# Patient Record
Sex: Female | Born: 1955
Health system: Southern US, Community
[De-identification: ages and names within clinical notes are randomized; demographics above are authoritative.]

## PROBLEM LIST (undated history)

## (undated) DIAGNOSIS — R918 Other nonspecific abnormal finding of lung field: Secondary | ICD-10-CM

## (undated) DIAGNOSIS — R112 Nausea with vomiting, unspecified: Secondary | ICD-10-CM

## (undated) DIAGNOSIS — E785 Hyperlipidemia, unspecified: Secondary | ICD-10-CM

## (undated) DIAGNOSIS — F329 Major depressive disorder, single episode, unspecified: Secondary | ICD-10-CM

## (undated) DIAGNOSIS — N390 Urinary tract infection, site not specified: Secondary | ICD-10-CM

## (undated) DIAGNOSIS — K5790 Diverticulosis of intestine, part unspecified, without perforation or abscess without bleeding: Secondary | ICD-10-CM

## (undated) DIAGNOSIS — J189 Pneumonia, unspecified organism: Secondary | ICD-10-CM

## (undated) DIAGNOSIS — J4521 Mild intermittent asthma with (acute) exacerbation: Secondary | ICD-10-CM

## (undated) DIAGNOSIS — B351 Tinea unguium: Secondary | ICD-10-CM

## (undated) DIAGNOSIS — M1990 Unspecified osteoarthritis, unspecified site: Secondary | ICD-10-CM

## (undated) DIAGNOSIS — C439 Malignant melanoma of skin, unspecified: Secondary | ICD-10-CM

## (undated) DIAGNOSIS — G43909 Migraine, unspecified, not intractable, without status migrainosus: Secondary | ICD-10-CM

## (undated) DIAGNOSIS — F32A Depression, unspecified: Secondary | ICD-10-CM

## (undated) DIAGNOSIS — Z9889 Other specified postprocedural states: Secondary | ICD-10-CM

## (undated) DIAGNOSIS — K219 Gastro-esophageal reflux disease without esophagitis: Secondary | ICD-10-CM

## (undated) DIAGNOSIS — I10 Essential (primary) hypertension: Secondary | ICD-10-CM

## (undated) HISTORY — DX: Malignant melanoma of skin, unspecified: C43.9

## (undated) HISTORY — PX: KNEE ARTHROSCOPY: SUR90

## (undated) HISTORY — DX: Essential (primary) hypertension: I10

## (undated) HISTORY — PX: OTHER SURGICAL HISTORY: SHX169

## (undated) HISTORY — DX: Mild intermittent asthma with (acute) exacerbation: J45.21

## (undated) HISTORY — DX: Tinea unguium: B35.1

## (undated) HISTORY — DX: Diverticulosis of intestine, part unspecified, without perforation or abscess without bleeding: K57.90

## (undated) HISTORY — DX: Depression, unspecified: F32.A

## (undated) HISTORY — DX: Unspecified osteoarthritis, unspecified site: M19.90

## (undated) HISTORY — DX: Other nonspecific abnormal finding of lung field: R91.8

## (undated) HISTORY — DX: Migraine, unspecified, not intractable, without status migrainosus: G43.909

## (undated) HISTORY — DX: Gastro-esophageal reflux disease without esophagitis: K21.9

## (undated) HISTORY — DX: Major depressive disorder, single episode, unspecified: F32.9

## (undated) HISTORY — DX: Hyperlipidemia, unspecified: E78.5

---

## 1996-01-23 HISTORY — PX: ABDOMINAL HYSTERECTOMY: SHX81

## 1997-05-03 ENCOUNTER — Other Ambulatory Visit: Admission: RE | Admit: 1997-05-03 | Discharge: 1997-05-03 | Payer: Self-pay | Admitting: Obstetrics and Gynecology

## 1999-02-06 ENCOUNTER — Ambulatory Visit (HOSPITAL_BASED_OUTPATIENT_CLINIC_OR_DEPARTMENT_OTHER): Admission: RE | Admit: 1999-02-06 | Discharge: 1999-02-06 | Payer: Self-pay | Admitting: Orthopedic Surgery

## 1999-07-04 ENCOUNTER — Encounter: Admission: RE | Admit: 1999-07-04 | Discharge: 1999-07-04 | Payer: Self-pay | Admitting: Obstetrics and Gynecology

## 1999-07-04 ENCOUNTER — Encounter: Payer: Self-pay | Admitting: Obstetrics and Gynecology

## 2000-07-09 ENCOUNTER — Encounter: Payer: Self-pay | Admitting: Obstetrics and Gynecology

## 2000-07-09 ENCOUNTER — Encounter: Admission: RE | Admit: 2000-07-09 | Discharge: 2000-07-09 | Payer: Self-pay | Admitting: Obstetrics and Gynecology

## 2001-03-05 ENCOUNTER — Encounter: Payer: Self-pay | Admitting: Obstetrics and Gynecology

## 2001-03-06 ENCOUNTER — Ambulatory Visit (HOSPITAL_COMMUNITY): Admission: RE | Admit: 2001-03-06 | Discharge: 2001-03-06 | Payer: Self-pay | Admitting: Obstetrics and Gynecology

## 2001-03-06 ENCOUNTER — Encounter: Payer: Self-pay | Admitting: Obstetrics and Gynecology

## 2001-03-12 ENCOUNTER — Encounter (INDEPENDENT_AMBULATORY_CARE_PROVIDER_SITE_OTHER): Payer: Self-pay | Admitting: Specialist

## 2001-03-12 ENCOUNTER — Inpatient Hospital Stay (HOSPITAL_COMMUNITY): Admission: RE | Admit: 2001-03-12 | Discharge: 2001-03-14 | Payer: Self-pay | Admitting: Obstetrics and Gynecology

## 2001-07-10 ENCOUNTER — Encounter: Payer: Self-pay | Admitting: Obstetrics and Gynecology

## 2001-07-10 ENCOUNTER — Encounter: Admission: RE | Admit: 2001-07-10 | Discharge: 2001-07-10 | Payer: Self-pay | Admitting: Obstetrics and Gynecology

## 2001-07-21 ENCOUNTER — Encounter: Payer: Self-pay | Admitting: Internal Medicine

## 2001-07-21 ENCOUNTER — Ambulatory Visit (HOSPITAL_COMMUNITY): Admission: RE | Admit: 2001-07-21 | Discharge: 2001-07-21 | Payer: Self-pay | Admitting: Internal Medicine

## 2001-10-23 ENCOUNTER — Encounter: Payer: Self-pay | Admitting: Internal Medicine

## 2001-10-23 ENCOUNTER — Ambulatory Visit (HOSPITAL_COMMUNITY): Admission: RE | Admit: 2001-10-23 | Discharge: 2001-10-23 | Payer: Self-pay | Admitting: Internal Medicine

## 2002-01-26 ENCOUNTER — Ambulatory Visit (HOSPITAL_COMMUNITY): Admission: RE | Admit: 2002-01-26 | Discharge: 2002-01-26 | Payer: Self-pay | Admitting: Internal Medicine

## 2002-01-26 ENCOUNTER — Encounter: Payer: Self-pay | Admitting: Internal Medicine

## 2002-07-29 ENCOUNTER — Encounter: Payer: Self-pay | Admitting: Internal Medicine

## 2002-07-29 ENCOUNTER — Ambulatory Visit (HOSPITAL_COMMUNITY): Admission: RE | Admit: 2002-07-29 | Discharge: 2002-07-29 | Payer: Self-pay | Admitting: Internal Medicine

## 2002-08-05 ENCOUNTER — Encounter: Payer: Self-pay | Admitting: Obstetrics and Gynecology

## 2002-08-05 ENCOUNTER — Encounter: Admission: RE | Admit: 2002-08-05 | Discharge: 2002-08-05 | Payer: Self-pay | Admitting: Obstetrics and Gynecology

## 2003-01-23 DIAGNOSIS — C439 Malignant melanoma of skin, unspecified: Secondary | ICD-10-CM

## 2003-01-23 HISTORY — DX: Malignant melanoma of skin, unspecified: C43.9

## 2003-02-04 ENCOUNTER — Ambulatory Visit (HOSPITAL_COMMUNITY): Admission: RE | Admit: 2003-02-04 | Discharge: 2003-02-04 | Payer: Self-pay | Admitting: Internal Medicine

## 2003-08-06 ENCOUNTER — Encounter: Admission: RE | Admit: 2003-08-06 | Discharge: 2003-08-06 | Payer: Self-pay | Admitting: Obstetrics and Gynecology

## 2004-08-16 ENCOUNTER — Encounter: Admission: RE | Admit: 2004-08-16 | Discharge: 2004-08-16 | Payer: Self-pay | Admitting: Obstetrics and Gynecology

## 2005-09-25 ENCOUNTER — Encounter: Admission: RE | Admit: 2005-09-25 | Discharge: 2005-09-25 | Payer: Self-pay | Admitting: Obstetrics and Gynecology

## 2006-03-12 ENCOUNTER — Ambulatory Visit: Payer: Self-pay | Admitting: Internal Medicine

## 2006-03-26 ENCOUNTER — Ambulatory Visit: Payer: Self-pay | Admitting: Internal Medicine

## 2006-09-27 ENCOUNTER — Encounter: Admission: RE | Admit: 2006-09-27 | Discharge: 2006-09-27 | Payer: Self-pay | Admitting: Obstetrics and Gynecology

## 2006-10-01 ENCOUNTER — Encounter: Admission: RE | Admit: 2006-10-01 | Discharge: 2006-10-01 | Payer: Self-pay | Admitting: Obstetrics and Gynecology

## 2007-06-12 ENCOUNTER — Encounter: Admission: RE | Admit: 2007-06-12 | Discharge: 2007-06-12 | Payer: Self-pay | Admitting: Obstetrics and Gynecology

## 2007-10-28 ENCOUNTER — Encounter: Admission: RE | Admit: 2007-10-28 | Discharge: 2007-10-28 | Payer: Self-pay | Admitting: Obstetrics and Gynecology

## 2008-12-07 ENCOUNTER — Encounter: Admission: RE | Admit: 2008-12-07 | Discharge: 2008-12-07 | Payer: Self-pay | Admitting: Obstetrics and Gynecology

## 2009-01-22 HISTORY — PX: BREAST BIOPSY: SHX20

## 2009-12-13 ENCOUNTER — Encounter: Admission: RE | Admit: 2009-12-13 | Discharge: 2009-12-13 | Payer: Self-pay | Admitting: Internal Medicine

## 2009-12-22 ENCOUNTER — Encounter: Admission: RE | Admit: 2009-12-22 | Discharge: 2009-12-22 | Payer: Self-pay | Admitting: Internal Medicine

## 2010-01-22 HISTORY — PX: JOINT REPLACEMENT: SHX530

## 2010-01-22 HISTORY — PX: BREAST BIOPSY: SHX20

## 2010-01-22 HISTORY — PX: BREAST EXCISIONAL BIOPSY: SUR124

## 2010-02-02 ENCOUNTER — Encounter
Admission: RE | Admit: 2010-02-02 | Discharge: 2010-02-02 | Payer: Self-pay | Source: Home / Self Care | Attending: Surgery | Admitting: Surgery

## 2010-02-02 ENCOUNTER — Ambulatory Visit (HOSPITAL_COMMUNITY)
Admission: RE | Admit: 2010-02-02 | Discharge: 2010-02-02 | Payer: Self-pay | Source: Home / Self Care | Attending: Surgery | Admitting: Surgery

## 2010-02-06 LAB — CBC
HCT: 37.2 % (ref 36.0–46.0)
Hemoglobin: 12.2 g/dL (ref 12.0–15.0)
MCH: 30.6 pg (ref 26.0–34.0)
MCHC: 32.8 g/dL (ref 30.0–36.0)
MCV: 93.2 fL (ref 78.0–100.0)
Platelets: 282 10*3/uL (ref 150–400)
RBC: 3.99 MIL/uL (ref 3.87–5.11)
RDW: 12.3 % (ref 11.5–15.5)
WBC: 8 10*3/uL (ref 4.0–10.5)

## 2010-02-06 LAB — BASIC METABOLIC PANEL
BUN: 17 mg/dL (ref 6–23)
CO2: 27 mEq/L (ref 19–32)
Calcium: 9.3 mg/dL (ref 8.4–10.5)
Chloride: 105 mEq/L (ref 96–112)
Creatinine, Ser: 0.69 mg/dL (ref 0.4–1.2)
GFR calc Af Amer: >60 mL/min (ref >60)
GFR calc non Af Amer: >60 mL/min (ref >60)
Glucose, Bld: 97 mg/dL (ref 70–99)
Potassium: 4.1 mEq/L (ref 3.5–5.1)
Sodium: 138 mEq/L (ref 135–145)

## 2010-02-06 LAB — DIFFERENTIAL
Basophils Absolute: 0 10*3/uL (ref 0.0–0.1)
Basophils Relative: 0 % (ref 0–1)
Eosinophils Absolute: 0.8 10*3/uL — ABNORMAL HIGH (ref 0.0–0.7)
Eosinophils Relative: 10 % — ABNORMAL HIGH (ref 0–5)
Lymphocytes Relative: 31 % (ref 12–46)
Lymphs Abs: 2.5 10*3/uL (ref 0.7–4.0)
Monocytes Absolute: 0.3 10*3/uL (ref 0.1–1.0)
Monocytes Relative: 4 % (ref 3–12)
Neutro Abs: 4.4 10*3/uL (ref 1.7–7.7)
Neutrophils Relative %: 55 % (ref 43–77)

## 2010-02-06 LAB — SURGICAL PCR SCREEN
MRSA, PCR: NEGATIVE
Staphylococcus aureus: NEGATIVE

## 2010-02-12 ENCOUNTER — Encounter: Payer: Self-pay | Admitting: Obstetrics and Gynecology

## 2010-06-09 NOTE — Op Note (Signed)
[facility]  Patient:    Casey Larsen, Casey Larsen Visit Number: [id number] MRN: [id number]          Service Type: Attending:  Katherine Roan, M.D. Dictated by:   S. Kyra Manges, M.D. Proc. Date: 03/12/01                             Operative Report  PREOPERATIVE DIAGNOSIS:  Large uterine fibroids.  POSTOPERATIVE DIAGNOSES: 1. Large uterine fibroids. 2. Left paraovarian cyst.  OPERATION: 1. Total abdominal hysterectomy. 2. Excision of left paraovarian cyst.  SURGEON:  S. Kyra Manges, M.D.  DESCRIPTION OF PROCEDURE:   The patient was placed in supine position after Foley was inserted and prepped and draped in the sterile fashion. A transverse incision was made in the abdomen and extended in layer to the peritoneal cavity, which was entered vertically. Exploration of the upper abdomen revealed a normal gallbladder. The liver was smooth. Both kidneys appeared to be normal. There was no periaortic adenopathy or any other mass that I could feel. There was no free fluid within the peritoneal cavity. The uterus was multinodular and as suggested about 14-week size. Cornual aspects of the uterus were grasped and elevated. She had a left paraovarian cyst which was simple in nature. The utero-ovarian anastomosis, round ligaments were sutured and ligated on each side. A bladder flap was created. Vessels were clamped and ligated. Cardinal-uterosacral complex was clamped and ligated and the specimens removed from the operative field. All pedicles were suture ligated with 0 chromic. Angles of the vagina were then transfixed with a horizontal mattress sutures of 0 chromic and then 0 Vicryl. Uterosacral ligaments were plicated carefully in the midline with 0 Vicryl. Bladder flap was then closed with interrupted sutures of 3-0 Vicryl. The paraovarian cyst was then delivered into the operative wound and excised preserving the left ovary. A portion of the tube came  with the paraovarian cyst. The right tube and ovary were normal. Copious amounts of saline were used to irrigate the pelvis. The parietal peritoneum was closed with 2-0 PDS. The fascia was closed with 2-0 PDS and the subcutaneum was closed with one 2-0 PDS. The skin was then closed with 4-0 PDS. Dry sterile dressing was applied. Marcaine 0.5% with epinephrine was instilled into the incision. The patient tolerated the procedure well. Estimated blood loss was 100 cc. Sponge and needle count was verified to be correct. Dictated by:   S. Kyra Manges, M.D. Attending:  Katherine Roan, M.D. DD:  03/12/01 TD:  03/12/01 Job: [id number]

## 2010-06-09 NOTE — Op Note (Signed)
[facility]  Patient:    Casey Larsen                           MRN: [id number] Proc. Date: 12/27/98 Adm. Date:  [id number] Attending:  Twana First                           Operative Report  PREOPERATIVE DIAGNOSIS:  Right knee lateral meniscal tear and lateral meniscal cyst.  POSTOPERATIVE DIAGNOSIS:  Right knee lateral meniscal tear and lateral meniscal  cyst.  OPERATION PERFORMED: 1. Right knee examination under anesthesia followed by arthroscopic partial    lateral meniscectomy. 2. Right knee open lateral meniscal cyst excision.  SURGEON:  Elana Alm. Thurston Hole, M.D.  ASSISTANT:  Kirstin Adelberger, P.A.  ANESTHESIA:  Local and MAC.  OPERATIVE TIME:  30 minutes.  COMPLICATIONS:  None.  INDICATIONS FOR PROCEDURE:  Ms. [name] is a 55 year old woman who has had three months of increasing right knee pain and swelling with lateral meniscal palpable cyst documented by MRI with an underlying lateral meniscal tear.  This is failed conservative care and he is now to undergo arthroscopy and meniscal cyst excision.  DESCRIPTION OF PROCEDURE:  Ms. [name] was brought to the operating room on December 27, 1998 after a block had been placed in the holding room.  Her right knee was examination under anesthesia.  Range of motion 0 to 125 degrees, 1+ crepitation, knee stable to ligamentous exam with a small palpable fullness over the lateral  joint line.  The right leg was prepped using sterile Betadine and draped using sterile technique.  Originally, through an inferolateral portal, the arthroscope with a pump attached was placed and through and inferomedial portal, an arthroscopic probe was placed.  On intial inspection of the medial compartment, the articular cartilage, medial femoral condyle, medial tibial plateau showed mild grade 1 to 2 changes.  The medial meniscus was probed and this was found to be normal.  The intercondylar notch was  inspected.  The anterior and posterior cruciate ligaments were found to be normal.  Lateral compartment inspected and articular cartilage, lateral femoral condyle was intact.  Lateral tibial plateau showed 30 to 40% grade 3 chondromalacia which was debrided.  The lateral meniscus showed complex tearing in the posterior and lateral horn of which 30 to 40% was  resected back to a stable rim.  The meniscal cyst could not be visualized from he inside of the joint.  The popliteus tendon was intact.  The patellofemoral joint was inspected.  The articular cartilage within the joint was normal.  The patella tracked normally.  Moderate synovitis in the medial and lateral gutters was debrided.  Otherwise they were free of pathology.  After this was done, the arthroscopic instruments were removed and a 2.5 to 3 cm lateral incision was made over the meniscal cyst.  The underlying subcutaneous tissues were incised in line with the skin incision.  The iliotibial band was opened lontitudinally revealing the underlying cyst which was removed and cauterized.  It was small but filled ith a gelatinous type ganglion type fluid.  The cyst was not sent for pathology due to the fact that it was partially destroyed in the midst of being cauterized and removed.  It appeared totally benign.  After this was done, no other pathology there was noted.  The wound was irrigated and then the iliotibial band closed with  2-0 Vicryl, subcutaneous tissues closed with 2-0 Vicryl, subcuticular layer closed with 3-0 Prolene.  Steri-Strips applied.  The wound was injected with 0.25% Marcaine with epinephrine and 5 mg of morphine.  A sterile dressing was applied and the patient awakened and taken to the recovery room in stable condition.  FOLLOW-UP:  Ms. [name] will be followed as an outpatient on Vicodin and Naprosyn.  I will see her back in the office in a week for suture removal and follow-up. DD:  02/06/99 TD:   02/06/99 Job: [id number]

## 2010-06-09 NOTE — H&P (Signed)
[facility]  Patient:    Casey Larsen, Casey Larsen Visit Number: [id number] MRN: [id number]          Service Type: Attending:  Katherine Roan, M.D. Dictated by:   S. Kyra Manges, M.D. Adm. Date:  03/12/01                           History and Physical  CHIEF COMPLAINT:  Large uterine fibroids.  HISTORY OF PRESENT ILLNESS:  The patient is a 55 year old gravida 2, para 2 female who presents for abdominal hysterectomy for enlarging uterine fibroids. Her periods have been controlled with Lo/Ovral; however, uterus is now about 14 weeks size, and it is recommended that she proceed with hysterectomy.  She is in agreement.  Her comorbidity is hypertension, which is well controlled. She has had knee surgery in the past.  She has had two spontaneous normal deliveries.  REVIEW OF SYSTEMS:  HEENT:  She wears reading glasses but has noted no decrease in visual or auditory acuity.  No dizziness.  HEART:  She has hypertension, well controlled.  No chest pain.  No history of heart murmur. No shortness of breath.  LUNGS:  No chronic cough.  No asthma.  GENITOURINARY: She denies stress incontinence.  She denies history of UTI for hematuria.  No history of nephritis.  GASTROINTESTINAL:  No bowel habit change, no melena, no weight loss, no indigestion.  MUSCLES, BONES, AND JOINTS:  She has had a recent arthroscopy.  SOCIAL HISTORY:  She works at the [employer].  Drinks alcohol socially.  Smokes a pack of cigarettes daily.  FAMILY HISTORY:  Her mother is living and well at 74.  Her father died at 31. Her father had an MI at 25.  He was diabetic.  She has one brother and one sister who are in good health.  She has a mother with breast cancer.  Her maternal aunt has lung cancer, and her uncle has "bone cancer."  PHYSICAL EXAMINATION:  GENERAL:  Well-developed and nourished female who appears to be her stated age.  Is oriented to time, place, and recent  events.  VITAL SIGNS:  Weight 156.  Blood pressure 120/60.  HEENT:  Ears, nose, throat unremarkable.  Oropharynx is not injected.  NECK:  Supple.  Thyroid is not enlarged.  Carotid pulses are equal.  No bruits are heard.  No adenopathy.  LUNGS:  Clear to P&A.  BREASTS:  No masses or tenderness.  HEART:  Normal sinus rhythm.  No murmurs.  No heaves, thrills, rubs, or gallops.  ABDOMEN:  Soft.  Liver, spleen, and kidneys are not palpated.  The uterus is palpated about three fingerbreadths above the umbilicus.  Bowel sounds are normal.  Femoral pulses are equal, without bruits.  PELVIC:  Clean cervix.  Uterus is anterior, about 14 weeks size with a large posterior fundal component.  No adnexal masses are noted.  RECTOVAGINAL:  Confirms.  EXTREMITIES:  Show good range of motion.  Equal pulses and reflexes.  IMPRESSION:  Large uterine fibroids.  PLAN:  Abdominal hysterectomy.  The risks have been discussed with the patient, and she has given detailed informed consent. Dictated by:   S. Kyra Manges, M.D. Attending:  Katherine Roan, M.D. DD:  03/11/01 TD:  03/11/01 Job: [id number]

## 2010-06-09 NOTE — Discharge Summary (Signed)
[facility]  Patient:    EVETT, KASSA Visit Number: [id number] MRN: [id number]          Service Type: GYN Location: 4W 0465 01 Attending Physician:  Lendon Colonel Dictated by:   Kathie Rhodes. Kyra Manges, M.D. Admit Date:  03/12/2001 Discharge Date: 03/14/2001                             Discharge Summary  ADMISSION DIAGNOSIS:  Large uterine fibroids.  DISCHARGE DIAGNOSIS:  Large uterine fibroids.  PROCEDURE:  Total abdominal hysterectomy.  HISTORY OF PRESENT ILLNESS:  Ms. Summons is a 55 year old female who was admitted to the hospital with a large uterine fibroid and hysterectomy was recommended.  LABORATORY DATA AND X-RAY FINDINGS:  Hemoglobin 13, hematocrit 38.  Routine chemistries normal.  Electrocardiogram normal.  Chest x-ray was normal.  This was noted after a limited CT was obtained.  HOSPITAL COURSE:  The patient was admitted to the hospital and underwent an uneventful hysterectomy with removal of a large uterine fibroid.  Uterine weight was 548 g.  There also was a left paraovarian cyst that was removed.  Her postoperative course was uncomplicated.  She remained afebrile without complaint on the day of discharge.  SPECIAL INSTRUCTIONS:  She was asked to call for fever or bleeding.  DISCHARGE MEDICATION:  Percocet for pain.  FOLLOWUP:  She will return to the office in six weeks.  CONDITION ON DISCHARGE:  Improved. Dictated by:   S. Kyra Manges, M.D. Attending Physician:  Lendon Colonel DD:  03/20/01 TD:  03/21/01 Job: [id number]

## 2010-11-09 ENCOUNTER — Other Ambulatory Visit: Payer: Self-pay | Admitting: Internal Medicine

## 2010-11-09 DIAGNOSIS — Z1231 Encounter for screening mammogram for malignant neoplasm of breast: Secondary | ICD-10-CM

## 2010-12-19 ENCOUNTER — Ambulatory Visit
Admission: RE | Admit: 2010-12-19 | Discharge: 2010-12-19 | Disposition: A | Discharge status: Home / Self Care | Payer: BC Managed Care – PPO | Source: Ambulatory Visit | Attending: Internal Medicine | Admitting: Internal Medicine

## 2010-12-19 DIAGNOSIS — Z1231 Encounter for screening mammogram for malignant neoplasm of breast: Secondary | ICD-10-CM

## 2011-02-15 ENCOUNTER — Other Ambulatory Visit: Payer: Self-pay | Admitting: Physician Assistant

## 2011-02-23 ENCOUNTER — Encounter: Payer: Self-pay | Admitting: Internal Medicine

## 2011-03-15 ENCOUNTER — Other Ambulatory Visit: Payer: Self-pay | Admitting: Physician Assistant

## 2011-03-29 ENCOUNTER — Encounter: Payer: Self-pay | Admitting: Internal Medicine

## 2011-05-08 ENCOUNTER — Ambulatory Visit (AMBULATORY_SURGERY_CENTER): Payer: BC Managed Care – PPO | Admitting: *Deleted

## 2011-05-08 VITALS — Ht 68.0 in | Wt 193.2 lb

## 2011-05-08 DIAGNOSIS — Z1211 Encounter for screening for malignant neoplasm of colon: Secondary | ICD-10-CM

## 2011-05-08 DIAGNOSIS — Z800 Family history of malignant neoplasm of digestive organs: Secondary | ICD-10-CM

## 2011-05-08 MED ORDER — PEG-KCL-NACL-NASULF-NA ASC-C 100 G PO SOLR: ORAL | Status: DC

## 2011-05-09 ENCOUNTER — Encounter: Payer: Self-pay | Admitting: Internal Medicine

## 2011-05-22 ENCOUNTER — Encounter: Payer: Self-pay | Admitting: Internal Medicine

## 2011-05-22 ENCOUNTER — Ambulatory Visit (AMBULATORY_SURGERY_CENTER): Payer: BC Managed Care – PPO | Admitting: Internal Medicine

## 2011-05-22 VITALS — BP 147/76 | HR 64 | Temp 97.8°F | Resp 14 | Ht 68.0 in | Wt 193.0 lb

## 2011-05-22 DIAGNOSIS — Z8371 Family history of colonic polyps: Secondary | ICD-10-CM

## 2011-05-22 DIAGNOSIS — Z1211 Encounter for screening for malignant neoplasm of colon: Secondary | ICD-10-CM

## 2011-05-22 DIAGNOSIS — K5730 Diverticulosis of large intestine without perforation or abscess without bleeding: Secondary | ICD-10-CM

## 2011-05-22 HISTORY — PX: COLONOSCOPY: SHX174

## 2011-05-22 MED ORDER — SODIUM CHLORIDE 0.9 % IV SOLN: 500.0000 mL | INTRAVENOUS | Status: DC

## 2011-05-22 NOTE — Progress Notes (Signed)
Patient did not have preoperative order for IV antibiotic SSI prophylaxis. (G8918)  Patient did not experience any of the following events: a burn prior to discharge; a fall within the facility; wrong site/side/patient/procedure/implant event; or a hospital transfer or hospital admission upon discharge from the facility. (G8907)  

## 2011-05-22 NOTE — Patient Instructions (Signed)
YOU HAD AN ENDOSCOPIC PROCEDURE TODAY AT THE [facility]: Refer to the procedure report that was given to you for any specific questions about what was found during the examination.  If the procedure report does not answer your questions, please call your gastroenterologist to clarify.  If you requested that your care partner not be given the details of your procedure findings, then the procedure report has been included in a sealed envelope for you to review at your convenience later.  YOU SHOULD EXPECT: Some feelings of bloating in the abdomen. Passage of more gas than usual.  Walking can help get rid of the air that was put into your GI tract during the procedure and reduce the bloating. If you had a lower endoscopy (such as a colonoscopy or flexible sigmoidoscopy) you may notice spotting of blood in your stool or on the toilet paper. If you underwent a bowel prep for your procedure, then you may not have a normal bowel movement for a few days.  DIET: Your first meal following the procedure should be a light meal and then it is ok to progress to your normal diet.  A half-sandwich or bowl of soup is an example of a good first meal.  Heavy or fried foods are harder to digest and may make you feel nauseous or bloated.  Likewise meals heavy in dairy and vegetables can cause extra gas to form and this can also increase the bloating.  Drink plenty of fluids but you should avoid alcoholic beverages for 24 hours.  ACTIVITY: Your care partner should take you home directly after the procedure.  You should plan to take it easy, moving slowly for the rest of the day.  You can resume normal activity the day after the procedure however you should NOT DRIVE or use heavy machinery for 24 hours (because of the sedation medicines used during the test).    SYMPTOMS TO REPORT IMMEDIATELY: A gastroenterologist can be reached at any hour.  During normal business hours, 8:30 AM to 5:00 PM Monday through Friday,  call [phone].  After hours and on weekends, please call the GI answering service at [phone] who will take a message and have the physician on call contact you.   Following lower endoscopy (colonoscopy or flexible sigmoidoscopy):  Excessive amounts of blood in the stool  Significant tenderness or worsening of abdominal pains  Swelling of the abdomen that is new, acute  Fever of 100F or higher    FOLLOW UP: If any biopsies were taken you will be contacted by phone or by letter within the next 1-3 weeks.  Call your gastroenterologist if you have not heard about the biopsies in 3 weeks.  Our staff will call the home number listed on your records the next business day following your procedure to check on you and address any questions or concerns that you may have at that time regarding the information given to you following your procedure. This is a courtesy call and so if there is no answer at the home number and we have not heard from you through the emergency physician on call, we will assume that you have returned to your regular daily activities without incident.  SIGNATURES/CONFIDENTIALITY: You and/or your care partner have signed paperwork which will be entered into your electronic medical record.  These signatures attest to the fact that that the information above on your After Visit Summary has been reviewed and is understood.  Full responsibility of the confidentiality   of this discharge information lies with you and/or your care-partner.     

## 2011-05-22 NOTE — Op Note (Signed)
 [facility] [address] [place], [place]  [address]  COLONOSCOPY PROCEDURE REPORT  PATIENT:  Casey Larsen, Casey Larsen  MR#:  [id number] BIRTHDATE:  15-Sep-1955, 56 yrs. old  GENDER:  female ENDOSCOPIST:  Wilhemina Bonito. Eda Keys, MD REF. BY:  Screening / Recall PROCEDURE DATE:  05/22/2011 PROCEDURE:  Higher-risk screening colonoscopy G0105  ASA CLASS:  Class II INDICATIONS:  surveillance and high-risk screening, family Hx of polyps ; index 03-2006 (severe diverticulosis with deep folds) MEDICATIONS:   MAC sedation, administered by CRNA, propofol (Diprivan) 350 mg IV  DESCRIPTION OF PROCEDURE:   After the risks benefits and alternatives of the procedure were thoroughly explained, informed consent was obtained.  Digital rectal exam was performed and revealed no abnormalities.   The LB CF-[id number] endoscope was introduced through the anus and advanced to the cecum, which was identified by both the appendix and ileocecal valve, without limitations.  The quality of the prep was excellent, using MoviPrep.  The instrument was then slowly withdrawn as the colon was fully examined. <<PROCEDUREIMAGES>>  FINDINGS:  Severe diverticulosis was found throughout the colon. Otherwise normal colonoscopy without  polyps, masses, vascular ectasias, or inflammatory changes.   Retroflexed views in the rectum revealed no abnormalities.    The time to cecum = 5:16 minutes. The scope was then withdrawn in  10:15  minutes from the cecum and the procedure completed.  COMPLICATIONS:  None  ENDOSCOPIC IMPRESSION: 1) Severe diverticulosis throughout the colon 2) Otherwise normal colonoscopy  RECOMMENDATIONS: 1) Continue current colorectal screening recommendations  with a repeat colonoscopy in 10 years.  ______________________________ Wilhemina Bonito. Eda Keys, MD  CC:  Geoffry Paradise, MD;  The Patient  n. eSIGNED:   Wilhemina Bonito. Eda Keys at 05/22/2011 10:03 AM  Mylo Red, [phone]

## 2011-05-22 NOTE — Progress Notes (Signed)
PT. Started having hiccups clear secretions from mouth noted. Marrion Coy CRNA  Suction small amount of secretions.

## 2011-05-23 ENCOUNTER — Telehealth: Payer: Self-pay | Admitting: *Deleted

## 2011-05-23 NOTE — Telephone Encounter (Signed)
  Follow up Call-  Call back number 05/22/2011  Post procedure Call Back phone  # [phone]  Permission to leave phone message Yes     Patient questions:  Do you have a fever, pain , or abdominal swelling? no Pain Score  0 *  Have you tolerated food without any problems? yes  Have you been able to return to your normal activities? yes  Do you have any questions about your discharge instructions: Diet   no Medications  no Follow up visit  no  Do you have questions or concerns about your Care? no  Actions: * If pain score is 4 or above: No action needed, pain <4.

## 2011-08-04 ENCOUNTER — Emergency Department (INDEPENDENT_AMBULATORY_CARE_PROVIDER_SITE_OTHER)
Admission: EM | Admit: 2011-08-04 | Discharge: 2011-08-04 | Disposition: A | Discharge status: Home / Self Care | Payer: BC Managed Care – PPO | Source: Home / Self Care | Attending: Emergency Medicine | Admitting: Emergency Medicine

## 2011-08-04 ENCOUNTER — Encounter (HOSPITAL_COMMUNITY): Payer: Self-pay | Admitting: Emergency Medicine

## 2011-08-04 DIAGNOSIS — L0390 Cellulitis, unspecified: Secondary | ICD-10-CM

## 2011-08-04 DIAGNOSIS — L0291 Cutaneous abscess, unspecified: Secondary | ICD-10-CM

## 2011-08-04 MED ORDER — CLINDAMYCIN HCL 150 MG PO CAPS: 300.0000 mg | ORAL_CAPSULE | Freq: Four times a day (QID) | ORAL | Status: AC

## 2011-08-04 NOTE — ED Notes (Addendum)
Swelling and redness extends above ankle. Pedal pulses 2 plus

## 2011-08-04 NOTE — ED Provider Notes (Signed)
Medical screening examination/treatment/procedure(s) were performed by non-physician practitioner and as supervising physician I was immediately available for consultation/collaboration.  Garlon Tuggle M. MD   Nolita Kutter M Malaky Tetrault, MD 08/04/11 2206 

## 2011-08-04 NOTE — ED Provider Notes (Signed)
History     CSN: [id number]  Arrival date & time 08/04/11  1135   First MD Initiated Contact with Patient 08/04/11 1234      Chief Complaint  Patient presents with  . Foot Pain    (Consider location/radiation/quality/duration/timing/severity/associated sxs/prior treatment) HPI Comments: Pt states that she was on the riding mower yesterday and she felt like something bit of stung her and now she is having redness to foot and ankle:pt denies history of reaction:pt has not tried any otc medication:pt denies any problems breathing  Patient is a 56 y.o. female presenting with lower extremity pain. The history is provided by the patient. No language interpreter was used.  Foot Pain This is a new problem. The current episode started yesterday. The problem occurs constantly. The problem has been gradually worsening. Pertinent negatives include no chest pain and no shortness of breath. Nothing aggravates the symptoms. Nothing relieves the symptoms. She has tried nothing for the symptoms.    Past Medical History  Diagnosis Date  . Arthritis     knees  . Melanoma 2005    face  . Depression   . GERD (gastroesophageal reflux disease)   . Hypertension     Past Surgical History  Procedure Date  . Abdominal hysterectomy 1998  . Knee arthroscopy 2000,2003    right and left  . Bone spurs 2010, 2012    both feet  . Joint replacement 2012    left foot  . Breast biopsy 2012    right, benign    Family History  Problem Relation Age of Onset  . Colon cancer Maternal Grandmother   . Stomach cancer Neg Hx     History  Substance Use Topics  . Smoking status: Former Smoker    Quit date: 05/07/2005  . Smokeless tobacco: Never Used  . Alcohol Use: 0.0 oz/week     occasional    OB History    Grav Para Term Preterm Abortions TAB SAB Ect Mult Living                  Review of Systems  Constitutional: Negative.   Respiratory: Negative for shortness of breath.   Cardiovascular:  Negative for chest pain.  Skin:       Red and swelling to the right foot    Allergies  Review of patient's allergies indicates no known allergies.  Home Medications   Current Outpatient Rx  Name Route Sig Dispense Refill  . CITALOPRAM HYDROBROMIDE 20 MG PO TABS Oral Take 20 mg by mouth daily.    Marland Kitchen GLUCOSAMINE-CHONDROITIN 500-400 MG PO TABS Oral Take 2 tablets by mouth daily.    Marland Kitchen LISINOPRIL-HYDROCHLOROTHIAZIDE 20-12.5 MG PO TABS Oral Take 2 tablets by mouth daily.    . MELOXICAM 15 MG PO TABS Oral Take 15 mg by mouth daily.     Marland Kitchen ONE-DAILY MULTI VITAMINS PO TABS Oral Take 1 tablet by mouth daily.    Marland Kitchen OMEPRAZOLE 20 MG PO CPDR Oral Take 20 mg by mouth daily.    Marland Kitchen ZOLPIDEM TARTRATE 10 MG PO TABS Oral Take 10 mg by mouth at bedtime as needed. Takes 0.5 tablet as needed      BP 131/91  Pulse 78  Temp 98.2 F (36.8 C) (Oral)  Resp 18  SpO2 99%  Physical Exam  Nursing note and vitals reviewed. Constitutional: She is oriented to person, place, and time. She appears well-developed and well-nourished.  HENT:  Head: Normocephalic and atraumatic.  Eyes: EOM are  normal.  Cardiovascular: Normal rate and regular rhythm.   Pulmonary/Chest: Effort normal and breath sounds normal.  Musculoskeletal:       Pulses intact  Neurological: She is alert and oriented to person, place, and time.  Skin:       Localized redness noted to the top of the right foot going towards the ankle:mild warmth noted:No localized insect bite noted    ED Course  Procedures (including critical care time)  Labs Reviewed - No data to display No results found.   1. Cellulitis       MDM  Cellulitis vs allergic reaction:will treat pt for both:pt tetanus is utd:pt given follow up instructions for benadryl and pepcid otc      mor  Teressa Lower, NP 08/04/11 1312

## 2011-08-04 NOTE — ED Notes (Signed)
Right foot swollen, noticed yesterday.  Reports she was mowing when she felt like a bee sting, reports never saw anything on her foot.  Reports this is different presentation than when she has been stung before.

## 2011-09-10 ENCOUNTER — Emergency Department (HOSPITAL_COMMUNITY): Payer: BC Managed Care – PPO

## 2011-09-10 ENCOUNTER — Emergency Department (HOSPITAL_COMMUNITY)
Admission: EM | Admit: 2011-09-10 | Discharge: 2011-09-10 | Disposition: A | Discharge status: Home / Self Care | Payer: BC Managed Care – PPO | Attending: Emergency Medicine | Admitting: Emergency Medicine

## 2011-09-10 ENCOUNTER — Encounter (HOSPITAL_COMMUNITY): Payer: Self-pay | Admitting: Emergency Medicine

## 2011-09-10 DIAGNOSIS — W19XXXA Unspecified fall, initial encounter: Secondary | ICD-10-CM | POA: Insufficient documentation

## 2011-09-10 DIAGNOSIS — T1490XA Injury, unspecified, initial encounter: Secondary | ICD-10-CM | POA: Insufficient documentation

## 2011-09-10 NOTE — ED Notes (Signed)
PT. SLIPPED ON WET WOOD THIS EVENING , HIT HEAD AGAINST PAVEMENT , NO LOC , PRESENTS WITH RIGHT KNEE PAIN WITH SUPERFICIAL ABRASION , SMALL LACERATION AT CHIN - BLEEDING CONTROLLED / RIGHT FACIAL PAIN . ALERT AND ORIENTED.

## 2011-11-14 ENCOUNTER — Other Ambulatory Visit: Payer: Self-pay | Admitting: Internal Medicine

## 2011-11-14 DIAGNOSIS — Z1231 Encounter for screening mammogram for malignant neoplasm of breast: Secondary | ICD-10-CM

## 2011-12-25 ENCOUNTER — Ambulatory Visit
Admission: RE | Admit: 2011-12-25 | Discharge: 2011-12-25 | Disposition: A | Discharge status: Home / Self Care | Payer: BC Managed Care – PPO | Source: Ambulatory Visit | Attending: Internal Medicine | Admitting: Internal Medicine

## 2011-12-25 DIAGNOSIS — Z1231 Encounter for screening mammogram for malignant neoplasm of breast: Secondary | ICD-10-CM

## 2012-03-12 DIAGNOSIS — B351 Tinea unguium: Secondary | ICD-10-CM

## 2012-03-12 HISTORY — DX: Tinea unguium: B35.1

## 2012-05-20 ENCOUNTER — Telehealth: Payer: Self-pay | Admitting: Internal Medicine

## 2012-05-20 NOTE — Telephone Encounter (Signed)
Pt scheduled to see Dr. Marina Goodell 05/22/12@10 :15am. Malachi Bonds to notify pt of appt date and time. States she will fax records.

## 2012-05-22 ENCOUNTER — Ambulatory Visit: Payer: BC Managed Care – PPO | Admitting: Internal Medicine

## 2012-10-13 ENCOUNTER — Encounter: Payer: Self-pay | Admitting: *Deleted

## 2012-10-13 DIAGNOSIS — B351 Tinea unguium: Secondary | ICD-10-CM | POA: Insufficient documentation

## 2012-10-23 ENCOUNTER — Ambulatory Visit (INDEPENDENT_AMBULATORY_CARE_PROVIDER_SITE_OTHER): Payer: PRIVATE HEALTH INSURANCE | Admitting: Podiatry

## 2012-10-23 ENCOUNTER — Encounter: Payer: Self-pay | Admitting: Podiatry

## 2012-10-23 VITALS — BP 116/64 | HR 74 | Resp 12

## 2012-10-23 DIAGNOSIS — B351 Tinea unguium: Secondary | ICD-10-CM

## 2012-10-23 NOTE — Progress Notes (Signed)
Subjective:     Patient ID: Casey Larsen, female   DOB: 31-May-1955, 57 y.o.   MRN: [id number]  HPI nail fungus hallux long-term duration   Review of Systems  All other systems reviewed and are negative.       Objective:   Physical Exam  Cardiovascular: Intact distal pulses.        Assessment:    mycotic nails improving with laser    Plan:     Laser performed today 1100 shocks that did well

## 2012-10-23 NOTE — Patient Instructions (Signed)
Call if nails change for 30 days of lamisil

## 2012-11-17 ENCOUNTER — Other Ambulatory Visit: Payer: Self-pay

## 2012-11-17 DIAGNOSIS — Z1231 Encounter for screening mammogram for malignant neoplasm of breast: Secondary | ICD-10-CM

## 2012-12-25 ENCOUNTER — Ambulatory Visit: Payer: BC Managed Care – PPO

## 2012-12-25 ENCOUNTER — Ambulatory Visit
Admission: RE | Admit: 2012-12-25 | Discharge: 2012-12-25 | Disposition: A | Discharge status: Home / Self Care | Payer: PRIVATE HEALTH INSURANCE | Source: Ambulatory Visit

## 2012-12-25 DIAGNOSIS — Z1231 Encounter for screening mammogram for malignant neoplasm of breast: Secondary | ICD-10-CM

## 2012-12-29 ENCOUNTER — Other Ambulatory Visit: Payer: Self-pay | Admitting: Internal Medicine

## 2012-12-29 DIAGNOSIS — R928 Other abnormal and inconclusive findings on diagnostic imaging of breast: Secondary | ICD-10-CM

## 2013-01-02 ENCOUNTER — Ambulatory Visit
Admission: RE | Admit: 2013-01-02 | Discharge: 2013-01-02 | Disposition: A | Discharge status: Home / Self Care | Payer: PRIVATE HEALTH INSURANCE | Source: Ambulatory Visit | Attending: Internal Medicine | Admitting: Internal Medicine

## 2013-01-02 DIAGNOSIS — R928 Other abnormal and inconclusive findings on diagnostic imaging of breast: Secondary | ICD-10-CM

## 2013-06-25 ENCOUNTER — Other Ambulatory Visit: Payer: Self-pay | Admitting: Internal Medicine

## 2013-06-25 DIAGNOSIS — N63 Unspecified lump in unspecified breast: Secondary | ICD-10-CM

## 2013-07-07 ENCOUNTER — Ambulatory Visit
Admission: RE | Admit: 2013-07-07 | Discharge: 2013-07-07 | Disposition: A | Discharge status: Home / Self Care | Payer: PRIVATE HEALTH INSURANCE | Source: Ambulatory Visit | Attending: Internal Medicine | Admitting: Internal Medicine

## 2013-07-07 ENCOUNTER — Encounter (INDEPENDENT_AMBULATORY_CARE_PROVIDER_SITE_OTHER): Payer: Self-pay

## 2013-07-07 DIAGNOSIS — N63 Unspecified lump in unspecified breast: Secondary | ICD-10-CM

## 2013-08-14 ENCOUNTER — Other Ambulatory Visit: Payer: Self-pay | Admitting: Physician Assistant

## 2013-10-21 ENCOUNTER — Encounter (HOSPITAL_COMMUNITY): Payer: Self-pay | Admitting: Pharmacy Technician

## 2013-10-22 ENCOUNTER — Encounter (HOSPITAL_COMMUNITY)
Admission: RE | Admit: 2013-10-22 | Discharge: 2013-10-22 | Disposition: A | Discharge status: Home / Self Care | Payer: No Typology Code available for payment source | Source: Ambulatory Visit | Attending: Orthopedic Surgery | Admitting: Orthopedic Surgery

## 2013-10-22 ENCOUNTER — Ambulatory Visit (HOSPITAL_COMMUNITY)
Admission: RE | Admit: 2013-10-22 | Discharge: 2013-10-22 | Disposition: A | Discharge status: Home / Self Care | Payer: No Typology Code available for payment source | Source: Ambulatory Visit | Attending: Anesthesiology | Admitting: Anesthesiology

## 2013-10-22 ENCOUNTER — Encounter (INDEPENDENT_AMBULATORY_CARE_PROVIDER_SITE_OTHER): Payer: Self-pay

## 2013-10-22 ENCOUNTER — Other Ambulatory Visit (HOSPITAL_COMMUNITY): Payer: Self-pay | Admitting: *Deleted

## 2013-10-22 ENCOUNTER — Encounter (HOSPITAL_COMMUNITY): Payer: Self-pay

## 2013-10-22 DIAGNOSIS — Z87891 Personal history of nicotine dependence: Secondary | ICD-10-CM | POA: Insufficient documentation

## 2013-10-22 DIAGNOSIS — Z01818 Encounter for other preprocedural examination: Secondary | ICD-10-CM | POA: Insufficient documentation

## 2013-10-22 LAB — CBC
HCT: 39.7 % (ref 36.0–46.0)
Hemoglobin: 13 g/dL (ref 12.0–15.0)
MCH: 30.3 pg (ref 26.0–34.0)
MCHC: 32.7 g/dL (ref 30.0–36.0)
MCV: 92.5 fL (ref 78.0–100.0)
Platelets: 303 10*3/uL (ref 150–400)
RBC: 4.29 MIL/uL (ref 3.87–5.11)
RDW: 12.5 % (ref 11.5–15.5)
WBC: 10.7 10*3/uL — ABNORMAL HIGH (ref 4.0–10.5)

## 2013-10-22 LAB — BASIC METABOLIC PANEL
Anion gap: 10 (ref 5–15)
BUN: 28 mg/dL — AB (ref 6–23)
CALCIUM: 9.2 mg/dL (ref 8.4–10.5)
CO2: 27 mEq/L (ref 19–32)
Chloride: 101 mEq/L (ref 96–112)
Creatinine, Ser: 0.82 mg/dL (ref 0.50–1.10)
GFR calc Af Amer: 90 mL/min — ABNORMAL LOW (ref >90)
GFR calc non Af Amer: 77 mL/min — ABNORMAL LOW (ref >90)
GLUCOSE: 113 mg/dL — AB (ref 70–99)
Potassium: 3.7 mEq/L (ref 3.7–5.3)
Sodium: 138 mEq/L (ref 137–147)

## 2013-10-22 LAB — URINALYSIS, ROUTINE W REFLEX MICROSCOPIC
Bilirubin Urine: NEGATIVE
Glucose, UA: NEGATIVE mg/dL
Ketones, ur: NEGATIVE mg/dL
LEUKOCYTES UA: NEGATIVE
Nitrite: NEGATIVE
PROTEIN: NEGATIVE mg/dL
Specific Gravity, Urine: 1.007 (ref 1.005–1.030)
Urobilinogen, UA: 1 mg/dL (ref 0.0–1.0)
pH: 6 (ref 5.0–8.0)

## 2013-10-22 LAB — URINE MICROSCOPIC-ADD ON

## 2013-10-22 LAB — SURGICAL PCR SCREEN
MRSA, PCR: NEGATIVE
Staphylococcus aureus: NEGATIVE

## 2013-10-22 LAB — PROTIME-INR
INR: 1.04 (ref 0.00–1.49)
Prothrombin Time: 13.8 seconds (ref 11.6–15.2)

## 2013-10-22 LAB — APTT: aPTT: 32 seconds (ref 24–37)

## 2013-10-22 NOTE — Patient Instructions (Addendum)
20     Your procedure is scheduled on:  Tuesday 10/27/2013  Report to [facility] Main Entrance and follow signs to Short Stay  at  0705 AM.  Call this number if you have problems the night before or morning of surgery: [phone]   Remember:          Do not eat food or drink liquids AFTER MIDNIGHT!  Take these medicines the morning of surgery with A SIP OF WATER: Omeprazole    [facility] IS NOT RESPONSIBLE FOR ANY BELONGINGS OR VALUABLES BROUGHT TO HOSPITAL.  Marland Kitchen  Leave suitcase in the car. After surgery it may be brought to your room.  For patients admitted to the hospital, checkout time is 11:00 AM the day of              Discharge.    DO NOT WEAR  JEWELRY,MAKE-UP,LOTIONS,POWDERS,PERFUMES,CONTACTS , DENTURES OR BRIDGEWORK ,AND DO NOT WEAR FALSE EYELASHES                                    Patients discharged the day of surgery will not be allowed to drive home.  If going home the same day of surgery, must have someone stay with you  first 24 hrs.at home and arrange for someone to drive you home from the [place]: N/A   Special Instructions:              Please read over the following fact sheets that you were given:             1. [place] - Preparing for Surgery Before surgery, you can play an important role.  Because skin is not sterile, your skin needs to be as free of germs as possible.  You can reduce the number of germs on your skin by washing with CHG (chlorahexidine gluconate) soap before surgery.  CHG is an antiseptic cleaner which kills germs and bonds with the skin to continue killing germs even after washing. Please DO NOT use if you have an allergy to CHG or antibacterial soaps.  If your skin becomes reddened/irritated stop using the CHG and inform your nurse when you arrive at Short  Stay. Do not shave (including legs and underarms) for at least 48 hours prior to the first CHG shower.  You may shave your face/neck. Please follow these instructions carefully:  1.  Shower with CHG Soap the night before surgery and the  morning of Surgery.  2.  If you choose to wash your hair, wash your hair first as usual with your  normal  shampoo.  3.  After you shampoo, rinse your hair and body thoroughly to remove the  shampoo.  4.  Use CHG as you would any other liquid soap.  You can apply chg directly  to the skin and wash                       Gently with a scrungie or clean washcloth.  5.  Apply the CHG Soap to your body ONLY FROM THE NECK DOWN.   Do not use on face/ open                           Wound or open sores. Avoid contact with eyes, ears mouth and genitals (private parts).                       Wash face,  Genitals (private parts) with your normal soap.             6.  Wash thoroughly, paying special attention to the area where your surgery  will be performed.  7.  Thoroughly rinse your body with warm water from the neck down.  8.  DO NOT shower/wash with your normal soap after using and rinsing off  the CHG Soap.                9.  Pat yourself dry with a clean towel.            10.  Wear clean pajamas.            11.  Place clean sheets on your bed the night of your first shower and do not  sleep with pets. Day of Surgery : Do not apply any lotions/deodorants the morning of surgery.  Please wear clean clothes to the hospital/surgery center.  FAILURE TO FOLLOW THESE INSTRUCTIONS MAY RESULT IN THE CANCELLATION OF YOUR SURGERY PATIENT SIGNATURE_________________________________  NURSE SIGNATURE__________________________________  ________________________________________________________________________   Adam Phenix  An incentive spirometer is a tool that can help keep your lungs clear and active. This tool measures how well you are  filling your lungs with each breath. Taking long deep breaths may help reverse or decrease the chance of developing breathing (pulmonary) problems (especially infection) following:  A long period of time when you are unable to move or be active. BEFORE THE PROCEDURE   If the spirometer includes an indicator to show your best effort, your nurse or respiratory therapist will set it to a desired goal.  If possible, sit up straight or lean slightly forward. Try not to slouch.  Hold the incentive spirometer in an upright position. INSTRUCTIONS FOR USE  1. Sit on the edge of your bed if possible, or sit up as far as you can in bed or on a chair. 2. Hold the incentive spirometer in an upright position. 3. Breathe out normally. 4. Place the mouthpiece in your mouth and seal your lips tightly around it. 5. Breathe in slowly and as deeply as possible, raising the piston or the ball toward the top of the column. 6. Hold your breath for 3-5 seconds or for as long as possible. Allow the piston or ball to fall to the bottom of the column. 7. Remove the mouthpiece from your mouth and breathe out normally. 8. Rest for a few seconds and repeat Steps 1 through 7 at least 10 times every 1-2 hours when you are awake. Take your time and take a few normal breaths between deep breaths. 9. The spirometer may include an indicator to show  your best effort. Use the indicator as a goal to work toward during each repetition. 10. After each set of 10 deep breaths, practice coughing to be sure your lungs are clear. If you have an incision (the cut made at the time of surgery), support your incision when coughing by placing a pillow or rolled up towels firmly against it. Once you are able to get out of bed, walk around indoors and cough well. You may stop using the incentive spirometer when instructed by your caregiver.  RISKS AND COMPLICATIONS  Take your time so you do not get dizzy or light-headed.  If you are in pain,  you may need to take or ask for pain medication before doing incentive spirometry. It is harder to take a deep breath if you are having pain. AFTER USE  Rest and breathe slowly and easily.  It can be helpful to keep track of a log of your progress. Your caregiver can provide you with a simple table to help with this. If you are using the spirometer at home, follow these instructions: [place] IF:   You are having difficultly using the spirometer.  You have trouble using the spirometer as often as instructed.  Your pain medication is not giving enough relief while using the spirometer.  You develop fever of 100.5 F (38.1 C) or higher. SEEK IMMEDIATE MEDICAL CARE IF:   You cough up bloody sputum that had not been present before.  You develop fever of 102 F (38.9 C) or greater.  You develop worsening pain at or near the incision site. MAKE SURE YOU:   Understand these instructions.  Will watch your condition.  Will get help right away if you are not doing well or get worse. Document Released: 05/21/2006 Document Revised: 04/02/2011 Document Reviewed: 07/22/2006 Exit[employer] Patient Information 2014 [employer], [place].   ________________________________________________________________________  WHAT IS A BLOOD TRANSFUSION? Blood Transfusion Information  A transfusion is the replacement of blood or some of its parts. Blood is made up of multiple cells which provide different functions.  Red blood cells carry oxygen and are used for blood loss replacement.  White blood cells fight against infection.  Platelets control bleeding.  Plasma helps clot blood.  Other blood products are available for specialized needs, such as hemophilia or other clotting disorders. BEFORE THE TRANSFUSION  Who gives blood for transfusions?   Healthy volunteers who are fully evaluated to make sure their blood is safe. This is blood bank blood. Transfusion therapy is the safest it has ever  been in the practice of medicine. Before blood is taken from a donor, a complete history is taken to make sure that person has no history of diseases nor engages in risky social behavior (examples are intravenous drug use or sexual activity with multiple partners). The donor's travel history is screened to minimize risk of transmitting infections, such as malaria. The donated blood is tested for signs of infectious diseases, such as HIV and hepatitis. The blood is then tested to be sure it is compatible with you in order to minimize the chance of a transfusion reaction. If you or a relative donates blood, this is often done in anticipation of surgery and is not appropriate for emergency situations. It takes many days to process the donated blood. RISKS AND COMPLICATIONS Although transfusion therapy is very safe and saves many lives, the main dangers of transfusion include:   Getting an infectious disease.  Developing a transfusion reaction. This is an allergic reaction to  something in the blood you were given. Every precaution is taken to prevent this. The decision to have a blood transfusion has been considered carefully by your caregiver before blood is given. Blood is not given unless the benefits outweigh the risks. AFTER THE TRANSFUSION  Right after receiving a blood transfusion, you will usually feel much better and more energetic. This is especially true if your red blood cells have gotten low (anemic). The transfusion raises the level of the red blood cells which carry oxygen, and this usually causes an energy increase.  The nurse administering the transfusion will monitor you carefully for complications. HOME CARE INSTRUCTIONS  No special instructions are needed after a transfusion. You may find your energy is better. Speak with your caregiver about any limitations on activity for underlying diseases you may have. SEEK MEDICAL CARE IF:   Your condition is not improving after your  transfusion.  You develop redness or irritation at the intravenous (IV) site. SEEK IMMEDIATE MEDICAL CARE IF:  Any of the following symptoms occur over the next 12 hours:  Shaking chills.  You have a temperature by mouth above 102 F (38.9 C), not controlled by medicine.  Chest, back, or muscle pain.  People around you feel you are not acting correctly or are confused.  Shortness of breath or difficulty breathing.  Dizziness and fainting.  You get a rash or develop hives.  You have a decrease in urine output.  Your urine turns a dark color or changes to pink, red, or brown. Any of the following symptoms occur over the next 10 days:  You have a temperature by mouth above 102 F (38.9 C), not controlled by medicine.  Shortness of breath.  Weakness after normal activity.  The white part of the eye turns yellow (jaundice).  You have a decrease in the amount of urine or are urinating less often.  Your urine turns a dark color or changes to pink, red, or brown. Document Released: 01/06/2000 Document Revised: 04/02/2011 Document Reviewed: 08/25/2007 [facility] Patient Information 2014 [place], [place].  _______________________________________________________________________

## 2013-10-22 NOTE — Progress Notes (Signed)
03/13/2013-Chest x-ray from [facility] on chart. 05/25/2013-EKG from [facility] on chart.

## 2013-10-24 NOTE — H&P (Signed)
TOTAL KNEE ADMISSION H&P  Patient is being admitted for left total knee arthroplasty.  Subjective:  Chief Complaint:    Left knee OA / pain.  HPI: Casey Larsen, 58 y.o. female, has a history of pain and functional disability in the left knee due to arthritis and has failed non-surgical conservative treatments for greater than 12 weeks to includeNSAID's and/or analgesics, corticosteriod injections and activity modification.  Onset of symptoms was gradual, starting 2+ years ago with gradually worsening course since that time. The patient noted prior procedures on the knee to include  arthroscopy on the left knee per Dr. Theda Sers.  Patient currently rates pain in the left knee(s) at 8 out of 10 with activity. Patient has night pain, worsening of pain with activity and weight bearing, pain that interferes with activities of daily living, pain with passive range of motion, crepitus and joint swelling.  Patient has evidence of periarticular osteophytes and joint space narrowing by imaging studies. There is no active infection.  Risks, benefits and expectations were discussed with the patient.  Risks including but not limited to the risk of anesthesia, blood clots, nerve damage, blood vessel damage, failure of the prosthesis, infection and up to and including death.  Patient understand the risks, benefits and expectations and wishes to proceed with surgery.   PCP: Geoffery Lyons, MD  D/C Plans:      Home with HHPT/SNF  Post-op Meds:       No Rx given   Tranexamic Acid:      To be given - IV    Decadron:      Is to be given  FYI:     ASA post-op  Norco post-op    Patient Active Problem List   Diagnosis Date Noted  . Dermatophytosis of nail    Past Medical History  Diagnosis Date  . Arthritis     knees  . Melanoma 2005    face  . GERD (gastroesophageal reflux disease)   . Hypertension   . Diverticulosis   . Hyperlipidemia   . Pulmonary nodules   . Dermatophytosis of nail [id number]   . Depression     with death of spouse and son  . Migraines     without headache per eye Doctor    Past Surgical History  Procedure Laterality Date  . Abdominal hysterectomy  1998  . Knee arthroscopy  2000,2003    right and left  . Bone spurs  2010, 2012    both feet  . Joint replacement  2012    left foot  . Breast biopsy  2012    right, benign    No prescriptions prior to admission   No Known Allergies   History  Substance Use Topics  . Smoking status: Former Smoker    Quit date: 05/07/2005  . Smokeless tobacco: Never Used  . Alcohol Use: 0.0 oz/week     Comment: occasional    Family History  Problem Relation Age of Onset  . Colon cancer Maternal Grandmother   . Breast cancer Mother   . Diabetes Mellitus I Son      Review of Systems  Constitutional: Negative.   Eyes: Negative.   Respiratory: Negative.   Cardiovascular: Negative.   Gastrointestinal: Positive for heartburn.  Genitourinary: Negative.   Musculoskeletal: Positive for joint pain.  Skin: Negative.   Neurological: Positive for headaches.  Endo/Heme/Allergies: Negative.   Psychiatric/Behavioral: Positive for depression.    Objective:  Physical Exam  Constitutional: She is oriented  to person, place, and time. She appears well-developed and well-nourished.  HENT:  Head: Normocephalic and atraumatic.  Eyes: Pupils are equal, round, and reactive to light.  Neck: Neck supple. No JVD present. No tracheal deviation present. No thyromegaly present.  Cardiovascular: Normal rate, regular rhythm, normal heart sounds and intact distal pulses.   Respiratory: Effort normal and breath sounds normal. No stridor. No respiratory distress. She has no wheezes.  GI: Soft. There is no tenderness. There is no guarding.  Musculoskeletal:       Left knee: She exhibits decreased range of motion, swelling and bony tenderness. She exhibits no ecchymosis, no deformity, no laceration and no erythema. Tenderness found.   Lymphadenopathy:    She has no cervical adenopathy.  Neurological: She is alert and oriented to person, place, and time.  Skin: Skin is warm and dry.  Psychiatric: She has a normal mood and affect.     Labs:  Estimated body mass index is 29.35 kg/(m^2) as calculated from the following:   Height as of 05/22/11: 5\' 8"  (1.727 m).   Weight as of 05/22/11: 87.544 kg (193 lb).   Imaging Review Plain radiographs demonstrate severe degenerative joint disease of the left knee(s). The overall alignment is neutral. The bone quality appears to be good for age and reported activity level.  Assessment/Plan:  End stage arthritis, left knee   The patient history, physical examination, clinical judgment of the provider and imaging studies are consistent with end stage degenerative joint disease of the left knee(s) and total knee arthroplasty is deemed medically necessary. The treatment options including medical management, injection therapy arthroscopy and arthroplasty were discussed at length. The risks and benefits of total knee arthroplasty were presented and reviewed. The risks due to aseptic loosening, infection, stiffness, patella tracking problems, thromboembolic complications and other imponderables were discussed. The patient acknowledged the explanation, agreed to proceed with the plan and consent was signed. Patient is being admitted for inpatient treatment for surgery, pain control, PT, OT, prophylactic antibiotics, VTE prophylaxis, progressive ambulation and ADL's and discharge planning. The patient is planning to be discharged to skilled nursing facility / home.    [place] P[place] Tyrica Afzal   PA-C  10/24/2013, 11:28 PM

## 2013-10-27 ENCOUNTER — Inpatient Hospital Stay (HOSPITAL_COMMUNITY): Payer: No Typology Code available for payment source | Admitting: Registered Nurse

## 2013-10-27 ENCOUNTER — Encounter (HOSPITAL_COMMUNITY): Payer: Self-pay

## 2013-10-27 ENCOUNTER — Inpatient Hospital Stay (HOSPITAL_COMMUNITY)
Admission: RE | Admit: 2013-10-27 | Discharge: 2013-10-28 | DRG: 470 | Disposition: A | Discharge status: Home Health | Payer: No Typology Code available for payment source | Source: Ambulatory Visit | Attending: Orthopedic Surgery | Admitting: Orthopedic Surgery

## 2013-10-27 ENCOUNTER — Encounter (HOSPITAL_COMMUNITY): Payer: No Typology Code available for payment source | Admitting: Registered Nurse

## 2013-10-27 ENCOUNTER — Encounter (HOSPITAL_COMMUNITY)
Admission: RE | Disposition: A | Discharge status: Home Health | Payer: Self-pay | Source: Ambulatory Visit | Attending: Orthopedic Surgery

## 2013-10-27 DIAGNOSIS — F329 Major depressive disorder, single episode, unspecified: Secondary | ICD-10-CM | POA: Diagnosis present

## 2013-10-27 DIAGNOSIS — Z90710 Acquired absence of both cervix and uterus: Secondary | ICD-10-CM

## 2013-10-27 DIAGNOSIS — M659 Synovitis and tenosynovitis, unspecified: Secondary | ICD-10-CM | POA: Diagnosis present

## 2013-10-27 DIAGNOSIS — Z833 Family history of diabetes mellitus: Secondary | ICD-10-CM

## 2013-10-27 DIAGNOSIS — Z96652 Presence of left artificial knee joint: Secondary | ICD-10-CM

## 2013-10-27 DIAGNOSIS — Z6830 Body mass index (BMI) 30.0-30.9, adult: Secondary | ICD-10-CM

## 2013-10-27 DIAGNOSIS — Z803 Family history of malignant neoplasm of breast: Secondary | ICD-10-CM

## 2013-10-27 DIAGNOSIS — M25762 Osteophyte, left knee: Secondary | ICD-10-CM | POA: Diagnosis present

## 2013-10-27 DIAGNOSIS — D62 Acute posthemorrhagic anemia: Secondary | ICD-10-CM | POA: Diagnosis not present

## 2013-10-27 DIAGNOSIS — M179 Osteoarthritis of knee, unspecified: Principal | ICD-10-CM | POA: Diagnosis present

## 2013-10-27 DIAGNOSIS — E785 Hyperlipidemia, unspecified: Secondary | ICD-10-CM | POA: Diagnosis present

## 2013-10-27 DIAGNOSIS — Z800 Family history of malignant neoplasm of digestive organs: Secondary | ICD-10-CM

## 2013-10-27 DIAGNOSIS — D500 Iron deficiency anemia secondary to blood loss (chronic): Secondary | ICD-10-CM | POA: Diagnosis not present

## 2013-10-27 DIAGNOSIS — Z87891 Personal history of nicotine dependence: Secondary | ICD-10-CM

## 2013-10-27 DIAGNOSIS — M25462 Effusion, left knee: Secondary | ICD-10-CM | POA: Diagnosis present

## 2013-10-27 DIAGNOSIS — Z85820 Personal history of malignant melanoma of skin: Secondary | ICD-10-CM

## 2013-10-27 DIAGNOSIS — E669 Obesity, unspecified: Secondary | ICD-10-CM | POA: Diagnosis present

## 2013-10-27 DIAGNOSIS — I10 Essential (primary) hypertension: Secondary | ICD-10-CM | POA: Diagnosis present

## 2013-10-27 DIAGNOSIS — K219 Gastro-esophageal reflux disease without esophagitis: Secondary | ICD-10-CM | POA: Diagnosis present

## 2013-10-27 DIAGNOSIS — Z96659 Presence of unspecified artificial knee joint: Secondary | ICD-10-CM

## 2013-10-27 HISTORY — PX: TOTAL KNEE ARTHROPLASTY: SHX125

## 2013-10-27 HISTORY — DX: Urinary tract infection, site not specified: N39.0

## 2013-10-27 LAB — TYPE AND SCREEN
ABO/RH(D): AB NEG
Antibody Screen: NEGATIVE

## 2013-10-27 LAB — ABO/RH: ABO/RH(D): AB NEG

## 2013-10-27 SURGERY — ARTHROPLASTY, KNEE, TOTAL
Anesthesia: Spinal | Site: Knee | Laterality: Left

## 2013-10-27 MED ORDER — PROPOFOL 10 MG/ML IV BOLUS
INTRAVENOUS | Status: AC
  Filled 2013-10-27: qty 20

## 2013-10-27 MED ORDER — DEXAMETHASONE SODIUM PHOSPHATE 10 MG/ML IJ SOLN: 10.0000 mg | Freq: Once | INTRAMUSCULAR | Status: DC

## 2013-10-27 MED ORDER — DEXAMETHASONE SODIUM PHOSPHATE 10 MG/ML IJ SOLN
10.0000 mg | Freq: Once | INTRAMUSCULAR | Status: AC
  Administered 2013-10-28: 10 mg via INTRAVENOUS
  Filled 2013-10-27: qty 1

## 2013-10-27 MED ORDER — MIDAZOLAM HCL 5 MG/5ML IJ SOLN
INTRAMUSCULAR | Status: DC | PRN
  Administered 2013-10-27 (×2): 1 mg via INTRAVENOUS

## 2013-10-27 MED ORDER — METOCLOPRAMIDE HCL 10 MG PO TABS: 5.0000 mg | ORAL_TABLET | Freq: Three times a day (TID) | ORAL | Status: DC | PRN

## 2013-10-27 MED ORDER — BISACODYL 10 MG RE SUPP: 10.0000 mg | Freq: Every day | RECTAL | Status: DC | PRN

## 2013-10-27 MED ORDER — PANTOPRAZOLE SODIUM 40 MG PO TBEC
40.0000 mg | DELAYED_RELEASE_TABLET | Freq: Every day | ORAL | Status: DC
  Administered 2013-10-27 – 2013-10-28 (×2): 40 mg via ORAL
  Filled 2013-10-27 (×3): qty 1

## 2013-10-27 MED ORDER — METHOCARBAMOL 500 MG PO TABS
500.0000 mg | ORAL_TABLET | Freq: Four times a day (QID) | ORAL | Status: DC | PRN
  Administered 2013-10-27 – 2013-10-28 (×2): 500 mg via ORAL
  Filled 2013-10-27 (×3): qty 1

## 2013-10-27 MED ORDER — DIPHENHYDRAMINE HCL 25 MG PO CAPS: 25.0000 mg | ORAL_CAPSULE | Freq: Four times a day (QID) | ORAL | Status: DC | PRN

## 2013-10-27 MED ORDER — MENTHOL 3 MG MT LOZG
1.0000 | LOZENGE | OROMUCOSAL | Status: DC | PRN
  Filled 2013-10-27: qty 9

## 2013-10-27 MED ORDER — LACTATED RINGERS IV SOLN: INTRAVENOUS | Status: DC

## 2013-10-27 MED ORDER — MIDAZOLAM HCL 2 MG/2ML IJ SOLN
INTRAMUSCULAR | Status: AC
  Filled 2013-10-27: qty 2

## 2013-10-27 MED ORDER — BUPIVACAINE IN DEXTROSE 0.75-8.25 % IT SOLN
INTRATHECAL | Status: DC | PRN
  Administered 2013-10-27: 2 mL via INTRATHECAL

## 2013-10-27 MED ORDER — CEFAZOLIN SODIUM-DEXTROSE 2-3 GM-% IV SOLR
INTRAVENOUS | Status: AC
  Filled 2013-10-27: qty 50

## 2013-10-27 MED ORDER — PHENOL 1.4 % MT LIQD
1.0000 | OROMUCOSAL | Status: DC | PRN
  Filled 2013-10-27: qty 177

## 2013-10-27 MED ORDER — ASPIRIN EC 325 MG PO TBEC
325.0000 mg | DELAYED_RELEASE_TABLET | Freq: Two times a day (BID) | ORAL | Status: DC
  Administered 2013-10-28: 325 mg via ORAL
  Filled 2013-10-27 (×3): qty 1

## 2013-10-27 MED ORDER — ONDANSETRON HCL 4 MG/2ML IJ SOLN
4.0000 mg | Freq: Four times a day (QID) | INTRAMUSCULAR | Status: DC | PRN
  Administered 2013-10-28: 4 mg via INTRAVENOUS
  Filled 2013-10-27: qty 2

## 2013-10-27 MED ORDER — CEFAZOLIN SODIUM-DEXTROSE 2-3 GM-% IV SOLR
2.0000 g | Freq: Four times a day (QID) | INTRAVENOUS | Status: AC
  Administered 2013-10-27 (×2): 2 g via INTRAVENOUS
  Filled 2013-10-27 (×2): qty 50

## 2013-10-27 MED ORDER — DEXTROSE 5 % IV SOLN
500.0000 mg | Freq: Four times a day (QID) | INTRAVENOUS | Status: DC | PRN
  Administered 2013-10-27: 500 mg via INTRAVENOUS
  Filled 2013-10-27: qty 5

## 2013-10-27 MED ORDER — CEFAZOLIN SODIUM-DEXTROSE 2-3 GM-% IV SOLR
2.0000 g | INTRAVENOUS | Status: AC
  Administered 2013-10-27: 2 g via INTRAVENOUS

## 2013-10-27 MED ORDER — FENTANYL CITRATE 0.05 MG/ML IJ SOLN
INTRAMUSCULAR | Status: DC | PRN
  Administered 2013-10-27: 50 ug via INTRAVENOUS

## 2013-10-27 MED ORDER — SODIUM CHLORIDE 0.9 % IR SOLN
Status: DC | PRN
  Administered 2013-10-27: 1000 mL

## 2013-10-27 MED ORDER — PROPOFOL INFUSION 10 MG/ML OPTIME
INTRAVENOUS | Status: DC | PRN
  Administered 2013-10-27: 75 ug/kg/min via INTRAVENOUS

## 2013-10-27 MED ORDER — BUPIVACAINE-EPINEPHRINE (PF) 0.25% -1:200000 IJ SOLN
INTRAMUSCULAR | Status: AC
  Filled 2013-10-27: qty 30

## 2013-10-27 MED ORDER — LACTATED RINGERS IV SOLN
INTRAVENOUS | Status: DC
  Administered 2013-10-27: 11:00:00 via INTRAVENOUS
  Administered 2013-10-27: 1000 mL via INTRAVENOUS

## 2013-10-27 MED ORDER — SODIUM CHLORIDE 0.9 % IJ SOLN
INTRAMUSCULAR | Status: AC
  Filled 2013-10-27: qty 10

## 2013-10-27 MED ORDER — ONDANSETRON HCL 4 MG/2ML IJ SOLN
INTRAMUSCULAR | Status: AC
  Filled 2013-10-27: qty 2

## 2013-10-27 MED ORDER — ONDANSETRON HCL 4 MG/2ML IJ SOLN
INTRAMUSCULAR | Status: DC | PRN
  Administered 2013-10-27: 4 mg via INTRAVENOUS

## 2013-10-27 MED ORDER — PROMETHAZINE HCL 25 MG/ML IJ SOLN: 6.2500 mg | INTRAMUSCULAR | Status: DC | PRN

## 2013-10-27 MED ORDER — FENTANYL CITRATE 0.05 MG/ML IJ SOLN
INTRAMUSCULAR | Status: AC
  Filled 2013-10-27: qty 2

## 2013-10-27 MED ORDER — KETOROLAC TROMETHAMINE 30 MG/ML IJ SOLN
INTRAMUSCULAR | Status: DC | PRN
  Administered 2013-10-27: 30 mg

## 2013-10-27 MED ORDER — SODIUM CHLORIDE 0.9 % IJ SOLN
INTRAMUSCULAR | Status: DC | PRN
  Administered 2013-10-27: 9 mL

## 2013-10-27 MED ORDER — 0.9 % SODIUM CHLORIDE (POUR BTL) OPTIME
TOPICAL | Status: DC | PRN
  Administered 2013-10-27: 1000 mL

## 2013-10-27 MED ORDER — TRANEXAMIC ACID 100 MG/ML IV SOLN
1000.0000 mg | Freq: Once | INTRAVENOUS | Status: AC
  Administered 2013-10-27: 1000 mg via INTRAVENOUS
  Filled 2013-10-27: qty 10

## 2013-10-27 MED ORDER — METOCLOPRAMIDE HCL 5 MG/ML IJ SOLN
5.0000 mg | Freq: Three times a day (TID) | INTRAMUSCULAR | Status: DC | PRN
  Administered 2013-10-28: 10 mg via INTRAVENOUS
  Filled 2013-10-27: qty 2

## 2013-10-27 MED ORDER — SODIUM CHLORIDE 0.9 % IV SOLN
INTRAVENOUS | Status: DC
  Administered 2013-10-27: 19:00:00 via INTRAVENOUS
  Filled 2013-10-27 (×4): qty 1000

## 2013-10-27 MED ORDER — ALUM & MAG HYDROXIDE-SIMETH 200-200-20 MG/5ML PO SUSP: 30.0000 mL | ORAL | Status: DC | PRN

## 2013-10-27 MED ORDER — HYDROMORPHONE HCL 1 MG/ML IJ SOLN
0.5000 mg | INTRAMUSCULAR | Status: DC | PRN
  Administered 2013-10-27 (×3): 1 mg via INTRAVENOUS
  Filled 2013-10-27 (×3): qty 1

## 2013-10-27 MED ORDER — CELECOXIB 200 MG PO CAPS
200.0000 mg | ORAL_CAPSULE | Freq: Two times a day (BID) | ORAL | Status: DC
  Administered 2013-10-27 – 2013-10-28 (×3): 200 mg via ORAL
  Filled 2013-10-27 (×4): qty 1

## 2013-10-27 MED ORDER — BUPIVACAINE LIPOSOME 1.3 % IJ SUSP
20.0000 mL | Freq: Once | INTRAMUSCULAR | Status: AC
  Administered 2013-10-27: 20 mL
  Filled 2013-10-27: qty 20

## 2013-10-27 MED ORDER — DOCUSATE SODIUM 100 MG PO CAPS
100.0000 mg | ORAL_CAPSULE | Freq: Two times a day (BID) | ORAL | Status: DC
  Administered 2013-10-27 – 2013-10-28 (×3): 100 mg via ORAL

## 2013-10-27 MED ORDER — POLYETHYLENE GLYCOL 3350 17 G PO PACK: 17.0000 g | PACK | Freq: Two times a day (BID) | ORAL | Status: DC

## 2013-10-27 MED ORDER — MEPERIDINE HCL 50 MG/ML IJ SOLN: 6.2500 mg | INTRAMUSCULAR | Status: DC | PRN

## 2013-10-27 MED ORDER — KETOROLAC TROMETHAMINE 30 MG/ML IJ SOLN
INTRAMUSCULAR | Status: AC
  Filled 2013-10-27: qty 1

## 2013-10-27 MED ORDER — BUPIVACAINE-EPINEPHRINE (PF) 0.25% -1:200000 IJ SOLN
INTRAMUSCULAR | Status: DC | PRN
  Administered 2013-10-27: 30 mL

## 2013-10-27 MED ORDER — HYDROCODONE-ACETAMINOPHEN 7.5-325 MG PO TABS
1.0000 | ORAL_TABLET | ORAL | Status: DC
  Administered 2013-10-27: 2 via ORAL
  Administered 2013-10-27: 1 via ORAL
  Administered 2013-10-27: 2 via ORAL
  Administered 2013-10-27: 1 via ORAL
  Administered 2013-10-28 (×3): 2 via ORAL
  Filled 2013-10-27: qty 1
  Filled 2013-10-27 (×5): qty 2
  Filled 2013-10-27: qty 1

## 2013-10-27 MED ORDER — LIDOCAINE HCL (CARDIAC) 20 MG/ML IV SOLN
INTRAVENOUS | Status: AC
  Filled 2013-10-27: qty 5

## 2013-10-27 MED ORDER — ONDANSETRON HCL 4 MG PO TABS: 4.0000 mg | ORAL_TABLET | Freq: Four times a day (QID) | ORAL | Status: DC | PRN

## 2013-10-27 MED ORDER — FERROUS SULFATE 325 (65 FE) MG PO TABS
325.0000 mg | ORAL_TABLET | Freq: Three times a day (TID) | ORAL | Status: DC
  Administered 2013-10-27 (×2): 325 mg via ORAL
  Filled 2013-10-27 (×6): qty 1

## 2013-10-27 MED ORDER — LIDOCAINE HCL (CARDIAC) 20 MG/ML IV SOLN
INTRAVENOUS | Status: DC | PRN
  Administered 2013-10-27: 100 mg via INTRAVENOUS

## 2013-10-27 MED ORDER — MAGNESIUM CITRATE PO SOLN: 1.0000 | Freq: Once | ORAL | Status: AC | PRN

## 2013-10-27 MED ORDER — PROPOFOL 10 MG/ML IV BOLUS
INTRAVENOUS | Status: DC | PRN
  Administered 2013-10-27: 30 mg via INTRAVENOUS
  Administered 2013-10-27: 20 mg via INTRAVENOUS

## 2013-10-27 MED ORDER — FENTANYL CITRATE 0.05 MG/ML IJ SOLN: 25.0000 ug | INTRAMUSCULAR | Status: DC | PRN

## 2013-10-27 MED ORDER — CHLORHEXIDINE GLUCONATE 4 % EX LIQD: 60.0000 mL | Freq: Once | CUTANEOUS | Status: DC

## 2013-10-27 SURGICAL SUPPLY — 59 items
ADH SKN CLS APL DERMABOND .7 (GAUZE/BANDAGES/DRESSINGS) ×1
BAG SPEC THK2 15X12 ZIP CLS (MISCELLANEOUS)
BAG ZIPLOCK 12X15 (MISCELLANEOUS) IMPLANT
BANDAGE ELASTIC 6 VELCRO ST LF (GAUZE/BANDAGES/DRESSINGS) ×3 IMPLANT
BANDAGE ESMARK 6X9 LF (GAUZE/BANDAGES/DRESSINGS) ×1 IMPLANT
BLADE SAW SGTL 13.0X1.19X90.0M (BLADE) ×3 IMPLANT
BNDG CMPR 9X6 STRL LF SNTH (GAUZE/BANDAGES/DRESSINGS) ×1
BNDG ESMARK 6X9 LF (GAUZE/BANDAGES/DRESSINGS) ×3
BOWL SMART MIX CTS (DISPOSABLE) ×3 IMPLANT
CAP KNEE ATTUNE RP ×2 IMPLANT
CEMENT HV SMART SET (Cement) ×4 IMPLANT
CUFF TOURN SGL QUICK 34 (TOURNIQUET CUFF) ×3
CUFF TRNQT CYL 34X4X40X1 (TOURNIQUET CUFF) ×1 IMPLANT
DERMABOND ADVANCED (GAUZE/BANDAGES/DRESSINGS) ×2
DERMABOND ADVANCED .7 DNX12 (GAUZE/BANDAGES/DRESSINGS) ×1 IMPLANT
DRAPE EXTREMITY TIBURON (DRAPES) ×3 IMPLANT
DRAPE POUCH INSTRU U-SHP 10X18 (DRAPES) ×3 IMPLANT
DRAPE U-SHAPE 47X51 STRL (DRAPES) ×3 IMPLANT
DRSG AQUACEL AG ADV 3.5X10 (GAUZE/BANDAGES/DRESSINGS) ×3 IMPLANT
DURAPREP 26ML APPLICATOR (WOUND CARE) ×6 IMPLANT
ELECT REM PT RETURN 9FT ADLT (ELECTROSURGICAL) ×3
ELECTRODE REM PT RTRN 9FT ADLT (ELECTROSURGICAL) ×1 IMPLANT
FACESHIELD WRAPAROUND (MASK) ×15 IMPLANT
FACESHIELD WRAPAROUND OR TEAM (MASK) ×5 IMPLANT
GLOVE BIO SURGEON STRL SZ8 (GLOVE) ×2 IMPLANT
GLOVE BIOGEL PI IND STRL 6.5 (GLOVE) IMPLANT
GLOVE BIOGEL PI IND STRL 7.5 (GLOVE) ×1 IMPLANT
GLOVE BIOGEL PI IND STRL 8 (GLOVE) IMPLANT
GLOVE BIOGEL PI IND STRL 8.5 (GLOVE) ×1 IMPLANT
GLOVE BIOGEL PI INDICATOR 6.5 (GLOVE) ×2
GLOVE BIOGEL PI INDICATOR 7.5 (GLOVE) ×2
GLOVE BIOGEL PI INDICATOR 8 (GLOVE) ×2
GLOVE BIOGEL PI INDICATOR 8.5 (GLOVE) ×4
GLOVE ECLIPSE 7.5 STRL STRAW (GLOVE) ×2 IMPLANT
GLOVE ECLIPSE 8.0 STRL XLNG CF (GLOVE) ×5 IMPLANT
GLOVE ORTHO TXT STRL SZ7.5 (GLOVE) ×6 IMPLANT
GOWN SPEC L3 XXLG W/TWL (GOWN DISPOSABLE) ×5 IMPLANT
GOWN STRL REUS W/TWL LRG LVL3 (GOWN DISPOSABLE) ×3 IMPLANT
GOWN STRL REUS W/TWL XL LVL3 (GOWN DISPOSABLE) ×2 IMPLANT
HANDPIECE INTERPULSE COAX TIP (DISPOSABLE) ×3
KIT BASIN OR (CUSTOM PROCEDURE TRAY) ×3 IMPLANT
MANIFOLD NEPTUNE II (INSTRUMENTS) ×3 IMPLANT
NDL SAFETY ECLIPSE 18X1.5 (NEEDLE) ×1 IMPLANT
NEEDLE HYPO 18GX1.5 SHARP (NEEDLE) ×3
PACK TOTAL JOINT (CUSTOM PROCEDURE TRAY) ×3 IMPLANT
POSITIONER SURGICAL ARM (MISCELLANEOUS) ×3 IMPLANT
SET HNDPC FAN SPRY TIP SCT (DISPOSABLE) ×1 IMPLANT
SET PAD KNEE POSITIONER (MISCELLANEOUS) ×3 IMPLANT
SUCTION FRAZIER 12FR DISP (SUCTIONS) ×3 IMPLANT
SUT MNCRL AB 4-0 PS2 18 (SUTURE) ×3 IMPLANT
SUT VIC AB 1 CT1 36 (SUTURE) ×3 IMPLANT
SUT VIC AB 2-0 CT1 27 (SUTURE) ×9
SUT VIC AB 2-0 CT1 TAPERPNT 27 (SUTURE) ×3 IMPLANT
SUT VLOC 180 0 24IN GS25 (SUTURE) ×3 IMPLANT
SYR 50ML LL SCALE MARK (SYRINGE) ×3 IMPLANT
TOWEL OR 17X26 10 PK STRL BLUE (TOWEL DISPOSABLE) ×3 IMPLANT
TRAY FOLEY CATH 14FRSI W/METER (CATHETERS) ×3 IMPLANT
WATER STERILE IRR 1500ML POUR (IV SOLUTION) ×3 IMPLANT
WRAP KNEE MAXI GEL POST OP (GAUZE/BANDAGES/DRESSINGS) ×3 IMPLANT

## 2013-10-27 NOTE — Anesthesia Preprocedure Evaluation (Addendum)
Anesthesia Evaluation  Patient identified by MRN, date of birth, ID band Patient awake    Reviewed: Allergy & Precautions, H&P , NPO status , Patient's Chart, lab work & pertinent test results  Airway Mallampati: II TM Distance: >3 FB Neck ROM: Full    Dental no notable dental hx.    Pulmonary former smoker,  breath sounds clear to auscultation  Pulmonary exam normal       Cardiovascular hypertension, Pt. on medications Rhythm:Regular Rate:Normal     Neuro/Psych negative neurological ROS  negative psych ROS   GI/Hepatic negative GI ROS, Neg liver ROS,   Endo/Other  negative endocrine ROS  Renal/GU negative Renal ROS  negative genitourinary   Musculoskeletal negative musculoskeletal ROS (+)   Abdominal   Peds negative pediatric ROS (+)  Hematology negative hematology ROS (+)   Anesthesia Other Findings   Reproductive/Obstetrics negative OB ROS                          Anesthesia Physical Anesthesia Plan  ASA: II  Anesthesia Plan: Spinal   Post-op Pain Management:    Induction: Intravenous  Airway Management Planned: Simple Face Mask  Additional Equipment:   Intra-op Plan:   Post-operative Plan:   Informed Consent: I have reviewed the patients History and Physical, chart, labs and discussed the procedure including the risks, benefits and alternatives for the proposed anesthesia with the patient or authorized representative who has indicated his/her understanding and acceptance.   Dental advisory given  Plan Discussed with: CRNA  Anesthesia Plan Comments:         Anesthesia Quick Evaluation

## 2013-10-27 NOTE — Progress Notes (Signed)
Clinical Social Work Department BRIEF PSYCHOSOCIAL ASSESSMENT 10/27/2013  Patient:  Casey Larsen, Casey Larsen     Account Number:  [id number]     Admit date:  10/27/2013  Clinical Social Worker:  Lacie Scotts  Date/Time:  10/27/2013 02:53 PM  Referred by:  Physician  Date Referred:  10/27/2013 Referred for  SNF Placement   Other Referral:   Interview type:  Patient Other interview type:    PSYCHOSOCIAL DATA Living Status:  ALONE Admitted from facility:   Level of care:   Primary support name:  Lucienne Minks Primary support relationship to patient:  CHILD, ADULT Degree of support available:   unclear    CURRENT CONCERNS Current Concerns  Post-Acute Placement   Other Concerns:    SOCIAL WORK ASSESSMENT / PLAN Pt is a 58 yr old female living at home prior to hospitalization. CSW met with pt to assist with d/c planning. This is a planned admission. Pt hopes to return home following hospital d/c. Pt will consider ST Rehab if recommended. CSW has initiated SNF search, in case needed, and bed offers are pending. Pt has [employer] insurance which requires prior authorization for placement. CSW will assist with authorization process if PT recommends ST Rehab and pt is in agreement.   Assessment/plan status:  Psychosocial Support/Ongoing Assessment of Needs Other assessment/ plan:   Information/referral to community resources:   Insurance coverage for SNF reviewed.    PATIENT'S/FAMILY'S RESPONSE TO PLAN OF CARE: Pt is happy surgery is over and it went well. Pt's mood is bright and pain is being controlled. Pt is motivated to work with therapy and hopes ST Rehab will not be needed. " I have someone that can stay with me for a week following d/c. I hope that's all I'll need. "    Werner Lean LCSW [phone]

## 2013-10-27 NOTE — Anesthesia Postprocedure Evaluation (Signed)
  Anesthesia Post-op Note  Patient: Casey Larsen  Procedure(s) Performed: Procedure(s) (LRB): LEFT TOTAL KNEE ARTHROPLASTY (Left)  Patient Location: PACU  Anesthesia Type: Spinal  Level of Consciousness: awake and alert   Airway and Oxygen Therapy: Patient Spontanous Breathing  Post-op Pain: mild  Post-op Assessment: Post-op Vital signs reviewed, Patient's Cardiovascular Status Stable, Respiratory Function Stable, Patent Airway and No signs of Nausea or vomiting  Last Vitals:  Filed Vitals:   10/27/13 1600  BP: 128/69  Pulse: 90  Temp: 37.3 C  Resp: 16    Post-op Vital Signs: stable   Complications: No apparent anesthesia complications

## 2013-10-27 NOTE — Interval H&P Note (Signed)
History and Physical Interval Note:  10/27/2013 7:34 AM  Casey Larsen  has presented today for surgery, with the diagnosis of left knee osteoarthritis  The various methods of treatment have been discussed with the patient and family. After consideration of risks, benefits and other options for treatment, the patient has consented to  Procedure(s): LEFT TOTAL KNEE ARTHROPLASTY (Left) as a surgical intervention .  The patient's history has been reviewed, patient examined, no change in status, stable for surgery.  I have reviewed the patient's chart and labs.  Questions were answered to the patient's satisfaction.     Mauri Pole

## 2013-10-27 NOTE — Anesthesia Procedure Notes (Addendum)
Spinal   Spinal  Patient location during procedure: OR Start time: 10/27/2013 10:02 AM Staffing Anesthesiologist: Montez Hageman CRNA/Resident: Sabina Beavers A Performed by: anesthesiologist and resident/CRNA  Preanesthetic Checklist Completed: patient identified, site marked, surgical consent, pre-op evaluation, timeout performed, IV checked, risks and benefits discussed and monitors and equipment checked Spinal Block Patient position: sitting Prep: Betadine Patient monitoring: blood pressure, continuous pulse ox and heart rate Approach: midline Location: L3-4 Injection technique: single-shot Needle Needle type: Sprotte  Needle gauge: 24 G Needle length: 9 cm Assessment Sensory level: T4 Additional Notes Attempt by CRNA x 1. Attempt x 1 by MDA.  -heme, + CSF. Pt tolerated well.

## 2013-10-27 NOTE — Progress Notes (Signed)
[facility]  [facility] is providing the following services: RW and [employer]  If patient discharges after hours, please call [phone].   Linward Headland 10/27/2013, 4:41 PM

## 2013-10-27 NOTE — Evaluation (Signed)
Physical Therapy Evaluation Patient Details Name: Casey Larsen MRN: [id number] DOB: [birth year] Today's Date: 10/27/2013   History of Present Illness  s/p L  TKA   Clinical Impression  Pt will benefit from PT to address deficits noted below; Pt reports that she prefers to go home if possible and will have assist as needed for 1wk; recommend HHPT and RW; Pt with many questions regarding CPM since she has heard that it is the answer to everything after knee surgery; Answered all questions and reinforced what pt can do actively (as far as knee ROM/strength)  being  the most important part of this process. Pt verbalizes understanding    Follow Up Recommendations Home health PT    Equipment Recommendations  Rolling walker with 5" wheels    Recommendations for Other Services       Precautions / Restrictions Precautions Precautions: Knee Restrictions Other Position/Activity Restrictions: WBAT LLE      Mobility  Bed Mobility Overal bed mobility: Needs Assistance Bed Mobility: Supine to Sit     Supine to sit: Min guard     General bed mobility comments: for safety, line management, pt moves quickly  Transfers Overall transfer level: Needs assistance Equipment used: Rolling walker (2 wheeled) Transfers: Sit to/from Stand Sit to Stand: Mod assist         General transfer comment: cues for hand placement and LE position  Ambulation/Gait Ambulation/Gait assistance: Min assist Ambulation Distance (Feet): 12 Feet Assistive device: Rolling walker (2 wheeled) Gait Pattern/deviations: Step-to pattern;Antalgic     General Gait Details: cues for sequence, use of UEs for pain control-decr WBing on LLE  Stairs            Wheelchair Mobility    Modified Rankin (Stroke Patients Only)       Balance                                             Pertinent Vitals/Pain Pain Assessment: 0-10 Pain Score: 9  Pain Location: left knee Pain  Intervention(s): Limited activity within patient's tolerance;Monitored during session;Repositioned;Ice applied;Patient requesting pain meds-RN notified    Home Living Family/patient expects to be discharged to:: Private residence Living Arrangements: Spouse/significant other   Type of Home: House Home Access: Stairs to enter   [employer] of Steps: 2-3 Home Layout: Two level;Able to live on main level with bedroom/bathroom Home Equipment: None      Prior Function Level of Independence: Independent               Hand Dominance        Extremity/Trunk Assessment   Upper Extremity Assessment: Overall WFL for tasks assessed;Defer to OT evaluation           Lower Extremity Assessment: LLE deficits/detail   LLE Deficits / Details: knee AAROm -10 to 50* flexion; knee extension 2+/5; hip flexion 2+/5     Communication   Communication: No difficulties  Cognition Arousal/Alertness: Awake/alert Behavior During Therapy: WFL for tasks assessed/performed Overall Cognitive Status: Within Functional Limits for tasks assessed                      General Comments      Exercises Total Joint Exercises Ankle Circles/Pumps: AROM;Both;10 reps Quad Sets: 10 reps;AROM;Both Heel Slides: AROM;Left;10 reps      Assessment/Plan    PT Assessment Patient needs  continued PT services  PT Diagnosis Difficulty walking   PT Problem List Decreased strength;Decreased activity tolerance;Decreased mobility;Decreased range of motion;Decreased knowledge of use of DME;Decreased knowledge of precautions  PT Treatment Interventions DME instruction;Gait training;Functional mobility training;Therapeutic activities;Therapeutic exercise;Stair training;Patient/family education   PT Goals (Current goals can be found in the Care Plan section) Acute Rehab PT Goals Patient Stated Goal: home  PT Goal Formulation: With patient Time For Goal Achievement: 10/30/13 Potential to Achieve  Goals: Good    Frequency 7X/week   Barriers to discharge        Co-evaluation               End of Session Equipment Utilized During Treatment: Gait belt Activity Tolerance: Patient tolerated treatment well;Patient limited by pain Patient left: in chair;with call bell/phone within reach;with nursing/sitter in room Nurse Communication: Mobility status         Time: 1710-1734 PT Time Calculation (min): 24 min   Charges:   PT Evaluation $Initial PT Evaluation Tier I: 1 Procedure PT Treatments $Gait Training: 8-22 mins   PT G Codes:          Casey Larsen 11/01/13, 5:59 PM

## 2013-10-27 NOTE — Op Note (Signed)
NAME:  Casey Larsen. [facility]                      MEDICAL RECORD NO.:  [id number]                             FACILITY:  [facility]      PHYSICIAN:  Pietro Cassis. Alvan Dame, M.D.  DATE OF BIRTH:  02/21/1955      DATE OF PROCEDURE:  10/27/2013                                     OPERATIVE REPORT         PREOPERATIVE DIAGNOSIS:  Left knee osteoarthritis.      POSTOPERATIVE DIAGNOSIS:  Left knee osteoarthritis.      FINDINGS:  The patient was noted to have complete loss of cartilage and   bone-on-bone arthritis with associated osteophytes in the medial and patellofemoral compartments of   the knee with a significant synovitis and associated effusion.      PROCEDURE:  Left total knee replacement.      COMPONENTS USED:  DePuy Attune rotating platform posterior stabilized knee   system, a size 6N femur, 5 tibia, size 6 mm AOX PS insert, and 38 Anatomic patellar   button.      SURGEON:  Pietro Cassis. Alvan Dame, M.D.      ASSISTANT:  Danae Orleans, PA-C.      ANESTHESIA:  Spinal.      SPECIMENS:  None.      COMPLICATION:  None.      DRAINS:  None.  EBL: <100cc      TOURNIQUET TIME:   Total Tourniquet Time Documented: Thigh (Left) - 30 minutes Total: Thigh (Left) - 30 minutes      The patient was stable to the recovery room.      INDICATION FOR PROCEDURE:  Casey Larsen is a 58 y.o. female patient of   mine.  The patient had been seen, evaluated, and treated conservatively in the   office with medication, activity modification, and injections.  The patient had   radiographic changes of bone-on-bone arthritis with endplate sclerosis and osteophytes noted.      The patient failed conservative measures including medication, injections, and activity modification, and at this point was ready for more definitive measures.   Based on the radiographic changes and failed conservative measures, the patient   decided to proceed with total knee replacement.  Risks of infection,   DVT, component failure,  need for revision surgery, postop course, and   expectations were all   discussed and reviewed.  Consent was obtained for benefit of pain   relief.      PROCEDURE IN DETAIL:  The patient was brought to the operative theater.   Once adequate anesthesia, preoperative antibiotics, 2 gm of Ancef administered, the patient was positioned supine with the left thigh tourniquet placed.  The  left lower extremity was prepped and draped in sterile fashion.  A time-   out was performed identifying the patient, planned procedure, and   extremity.      The left lower extremity was placed in the [facility] leg holder.  The leg was   exsanguinated, tourniquet elevated to 250 mmHg.  A midline incision was   made followed by median parapatellar arthrotomy.  Following initial   exposure, attention  was first directed to the patella.  Precut   measurement was noted to be 24 mm.  I resected down to 14 mm and used a   38 patellar button to restore patellar height as well as cover the cut   surface.      The lug holes were drilled and a metal shim was placed to protect the   patella from retractors and saw blades.      At this point, attention was now directed to the femur.  The femoral   canal was opened with a drill, irrigated to try to prevent fat emboli.  An   intramedullary rod was passed at 3 degrees valgus, 9 mm of bone was   resected off the distal femur.  Following this resection, the tibia was   subluxated anteriorly.  Using the extramedullary guide, 3 mm of bone was resected off   the proximal medial tibia.  We confirmed the gap would be   stable medially and laterally with a size 5-6 insert as well as confirmed   the cut was perpendicular in the coronal plane, checking with an alignment rod.      Once this was done, I sized the femur to be a size 6 in the anterior-   posterior dimension, chose a narrow component based on medial and   lateral dimension.  The size 6 rotation block was then pinned in    position anterior referenced using the the tensioning device to set rotation and match extension and flexion gaps.  The   anterior, posterior, and  chamfer cuts were made without difficulty nor   notching making certain that I was along the anterior cortex to help   with flexion gap stability.      The final box cut was made off the lateral aspect of distal femur.      At this point, the tibia was sized to be a size 5, the size 5 tray was   then pinned in position through the medial third of the tubercle,   drilled, and keel punched.  Trial reduction was now carried with a 6 femur,  5 tibia, a size 6 mm insert, and the 38 anatomic patella botton.  The knee was brought to   extension, full extension with good flexion stability with the patella   tracking through the trochlea without application of pressure.  Given   all these findings, the trial components removed.  Final components were   opened and cement was mixed.  The knee was irrigated with normal saline   solution and pulse lavage.  The synovial lining was   then injected with 20cc of Exparel, 30cc of 0.25% Marcaine with epinephrine and 1 cc of Toradol,   total of 61 cc.      The knee was irrigated.  Final implants were then cemented onto clean and   dried cut surfaces of bone with the knee brought to extension with a size 6 mm trial insert.      Once the cement had fully cured, the excess cement was removed   throughout the knee.  I confirmed I was satisfied with the range of   motion and stability, and the final size 6 mm AOX PS insert was chosen.  It was   placed into the knee.      The tourniquet had been let down at 30 minutes.  No significant   hemostasis required.  The   extensor mechanism was then reapproximated using #1  Vicryl with the knee   in flexion.  The   remaining wound was closed with 2-0 Vicryl and running 4-0 Monocryl.   The knee was cleaned, dried, dressed sterilely using Dermabond and   Aquacel dressing.   The patient was then   brought to recovery room in stable condition, tolerating the procedure   well.   Please note that Physician Assistant, Danae Orleans, PA-C, was present for the entirety of the case, and was utilized for pre-operative positioning, peri-operative retractor management, general facilitation of the procedure.  He was also utilized for primary wound closure at the end of the case.              Pietro Cassis Alvan Dame, M.D.    10/27/2013 11:21 AM

## 2013-10-27 NOTE — Transfer of Care (Signed)
Immediate Anesthesia Transfer of Care Note  Patient: Casey Larsen  Procedure(s) Performed: Procedure(s): LEFT TOTAL KNEE ARTHROPLASTY (Left)  Patient Location: PACU  Anesthesia Type:MAC and Spinal  Level of Consciousness: awake, alert , oriented and patient cooperative  Airway & Oxygen Therapy: Patient Spontanous Breathing and Patient connected to face mask oxygen  Post-op Assessment: Report given to PACU RN, Post -op Vital signs reviewed and stable and Patient moving all extremities  Post vital signs: Reviewed and stable  Complications: No apparent anesthesia complications

## 2013-10-28 DIAGNOSIS — D500 Iron deficiency anemia secondary to blood loss (chronic): Secondary | ICD-10-CM | POA: Diagnosis not present

## 2013-10-28 DIAGNOSIS — E669 Obesity, unspecified: Secondary | ICD-10-CM | POA: Diagnosis present

## 2013-10-28 LAB — BASIC METABOLIC PANEL
Anion gap: 10 (ref 5–15)
BUN: 19 mg/dL (ref 6–23)
CO2: 26 mEq/L (ref 19–32)
CREATININE: 0.87 mg/dL (ref 0.50–1.10)
Calcium: 8.4 mg/dL (ref 8.4–10.5)
Chloride: 104 mEq/L (ref 96–112)
GFR calc Af Amer: 83 mL/min — ABNORMAL LOW (ref >90)
GFR calc non Af Amer: 72 mL/min — ABNORMAL LOW (ref >90)
GLUCOSE: 99 mg/dL (ref 70–99)
POTASSIUM: 4.2 meq/L (ref 3.7–5.3)
Sodium: 140 mEq/L (ref 137–147)

## 2013-10-28 LAB — CBC
HEMATOCRIT: 32.2 % — AB (ref 36.0–46.0)
HEMOGLOBIN: 10.4 g/dL — AB (ref 12.0–15.0)
MCH: 30.2 pg (ref 26.0–34.0)
MCHC: 32.3 g/dL (ref 30.0–36.0)
MCV: 93.6 fL (ref 78.0–100.0)
Platelets: 226 10*3/uL (ref 150–400)
RBC: 3.44 MIL/uL — ABNORMAL LOW (ref 3.87–5.11)
RDW: 12.6 % (ref 11.5–15.5)
WBC: 8.9 10*3/uL (ref 4.0–10.5)

## 2013-10-28 MED ORDER — METHOCARBAMOL 500 MG PO TABS: 500.0000 mg | ORAL_TABLET | Freq: Four times a day (QID) | ORAL | Status: DC | PRN

## 2013-10-28 MED ORDER — DSS 100 MG PO CAPS: 100.0000 mg | ORAL_CAPSULE | Freq: Two times a day (BID) | ORAL | Status: DC

## 2013-10-28 MED ORDER — CELECOXIB 200 MG PO CAPS: 200.0000 mg | ORAL_CAPSULE | Freq: Two times a day (BID) | ORAL | Status: DC

## 2013-10-28 MED ORDER — FERROUS SULFATE 325 (65 FE) MG PO TABS: 325.0000 mg | ORAL_TABLET | Freq: Three times a day (TID) | ORAL | Status: DC

## 2013-10-28 MED ORDER — POLYETHYLENE GLYCOL 3350 17 G PO PACK: 17.0000 g | PACK | Freq: Two times a day (BID) | ORAL | Status: DC

## 2013-10-28 MED ORDER — ASPIRIN 325 MG PO TBEC: 325.0000 mg | DELAYED_RELEASE_TABLET | Freq: Two times a day (BID) | ORAL | Status: AC

## 2013-10-28 MED ORDER — PROMETHAZINE HCL 12.5 MG PO TABS
6.2500 mg | ORAL_TABLET | Freq: Four times a day (QID) | ORAL | Status: DC | PRN
  Filled 2013-10-28: qty 1

## 2013-10-28 MED ORDER — HYDROCODONE-ACETAMINOPHEN 7.5-325 MG PO TABS: 1.0000 | ORAL_TABLET | ORAL | Status: DC | PRN

## 2013-10-28 MED ORDER — PROMETHAZINE HCL 25 MG/ML IJ SOLN: 6.2500 mg | Freq: Four times a day (QID) | INTRAMUSCULAR | Status: DC | PRN

## 2013-10-28 MED ORDER — PROMETHAZINE HCL 12.5 MG PO TABS: 12.5000 mg | ORAL_TABLET | Freq: Four times a day (QID) | ORAL | Status: DC | PRN

## 2013-10-28 NOTE — Progress Notes (Signed)
Physical Therapy Treatment Patient Details Name: PAXTON KANAAN MRN: [id number] DOB: [birth year] Today's Date: 20-Nov-2013    History of Present Illness s/p L  TKA     PT Comments    Pt progressing well.  Stairs reviewed and pt eager for d/c.  Follow Up Recommendations  Home health PT     Equipment Recommendations  Rolling walker with 5" wheels    Recommendations for Other Services OT consult     Precautions / Restrictions Precautions Precautions: Knee Restrictions Weight Bearing Restrictions: No LLE Weight Bearing: Weight bearing as tolerated Other Position/Activity Restrictions: WBAT LLE    Mobility  Bed Mobility Overal bed mobility: Needs Assistance Bed Mobility: Supine to Sit     Supine to sit: Min assist     General bed mobility comments: asssist for LLE plus increased time  Transfers Overall transfer level: Needs assistance Equipment used: Rolling walker (2 wheeled) Transfers: Sit to/from Stand Sit to Stand: Supervision         General transfer comment: cues for LE management and use of UEs to self assist  Ambulation/Gait Ambulation/Gait assistance: Min guard;Supervision Ambulation Distance (Feet): 190 Feet Assistive device: Rolling walker (2 wheeled) Gait Pattern/deviations: Step-to pattern;Step-through pattern;Shuffle Gait velocity: decreased   General Gait Details: cues for posture and position from RW   Stairs Stairs: Yes Stairs assistance: Min assist Stair Management: No rails;Step to pattern;Backwards;With walker Number of Stairs: 4 (2 stairs twice) General stair comments: cues for sequence and foot/RW placement  Wheelchair Mobility    Modified Rankin (Stroke Patients Only)       Balance                                    Cognition Arousal/Alertness: Awake/alert Behavior During Therapy: WFL for tasks assessed/performed Overall Cognitive Status: Within Functional Limits for tasks assessed                      Exercises      General Comments        Pertinent Vitals/Pain Pain Assessment: 0-10 Pain Score: 3  Pain Location: L knee Pain Descriptors / Indicators: Sore Pain Intervention(s): Limited activity within patient's tolerance;Monitored during session;Premedicated before session;Ice applied    Home Living                      Prior Function            PT Goals (current goals can now be found in the care plan section) Acute Rehab PT Goals Patient Stated Goal: home PT Goal Formulation: With patient Time For Goal Achievement: 10/30/13 Potential to Achieve Goals: Good Progress towards PT goals: Progressing toward goals    Frequency  7X/week    PT Plan Current plan remains appropriate    Co-evaluation             End of Session Equipment Utilized During Treatment: Gait belt Activity Tolerance: Patient tolerated treatment well Patient left: in chair;with call bell/phone within reach     Time: 1702-1717 PT Time Calculation (min): 15 min  Charges:  $Gait Training: 8-22 mins $Therapeutic Exercise: 8-22 mins                    G Codes:      Venson Ferencz 11/20/2013, 5:43 PM

## 2013-10-28 NOTE — Plan of Care (Signed)
Problem: Consults Goal: Diagnosis- Total Joint Replacement Outcome: Completed/Met Date Met:  10/28/13 Primary Total Knee LEFT

## 2013-10-28 NOTE — Progress Notes (Signed)
     Subjective: 1 Day Post-Op Procedure(s) (LRB): LEFT TOTAL KNEE ARTHROPLASTY (Left)   Patient reports pain as mild, pain controlled. Started to have some nausea this morning, feels it might have been because of taking meds without food on her stomach. No events otherwise throughout the night. Ready to be discharged home if she does well with PT, pain stays controlled and nausea is controlled.   Objective:   VITALS:   Filed Vitals:   10/28/13  BP: 131/71  Pulse: 67  Temp: 97.7 F (36.5 C)   Resp: 18    Dorsiflexion/Plantar flexion intact Incision: dressing C/D/I No cellulitis present Compartment soft  LABS  Recent Labs  10/28/13 0440  HGB 10.4*  HCT 32.2*  WBC 8.9  PLT 226     Recent Labs  10/28/13 0440  NA 140  K 4.2  BUN 19  CREATININE 0.87  GLUCOSE 99     Assessment/Plan: 1 Day Post-Op Procedure(s) (LRB): LEFT TOTAL KNEE ARTHROPLASTY (Left) Foley cath d/c'ed Added Phenergan for N&V Advance diet Up with therapy D/C IV fluids Discharge home with home health Follow up in 2 weeks at [facility]. Follow up with OLIN,Chan Rosasco D in 2 weeks.  Contact information:  [facility] [address] [place] [phone]    Expected ABLA  Treated with iron and will observe  Obese (BMI 30-39.9) Estimated body mass index is 30.87 kg/(m^2) as calculated from the following:   Height as of this encounter: 5\' 8"  (1.727 m).   Weight as of this encounter: 92.08 kg (203 lb). Patient also counseled that weight may inhibit the healing process Patient counseled that losing weight will help with future health issues      [place]. Hasaan Radde   PAC  10/28/2013, 8:59 AM

## 2013-10-28 NOTE — Care Management Note (Signed)
    Page 1 of 2   10/28/2013     4:10:53 PM CARE MANAGEMENT NOTE 10/28/2013  Patient:  Casey Larsen, Casey Larsen   Account Number:  [id number]  Date Initiated:  10/28/2013  Documentation initiated by:  [facility]  Subjective/Objective Assessment:   adm: LEFT TOTAL KNEE ARTHROPLASTY (Left)     Action/Plan:   discharge planning   Anticipated DC Date:  10/28/2013   Anticipated DC Plan:  [place]         [facility] Choice  HOME HEALTH   Choice offered to / List presented to:  C-1 Patient   DME arranged  3-N-1  Vassie Moselle      DME agency  [place] arranged  [place]   Status of service:  Completed, signed off Medicare Important Message given?   (If response is "NO", the following Medicare IM given date fields will be blank) Date Medicare IM given:   Medicare IM given by:   Date Additional Medicare IM given:   Additional Medicare IM given by:    Discharge Disposition:    Per UR Regulation:  Reviewed for med. necessity/level of care/duration of stay  If discussed at Long Length of Stay Meetings, dates discussed:    Comments:  Dellie Catholic, RN Registered Nurse Signed CASE MANAGEMENT Progress Notes Service date: 10/28/2013 11:54 AM 10/28/13 08:00 CM met with pt in room to offer choice for home health agency.  Pt chooses [facility] to render HHPT. Address and contact information verified with pt.  Pt requests primary contact number be her mobile number. Referral given to [place] rep on unit, [facility].  DME to be delivered to room prior to discharge by [place].  No other CM needs were communicated.  Mariane Masters, BSN, CM [phone].

## 2013-10-28 NOTE — Evaluation (Signed)
Occupational Therapy Evaluation Patient Details Name: Casey Larsen MRN: [id number] DOB: February 28, 1955 Today's Date: 10/28/2013    History of Present Illness s/p L  TKA    Clinical Impression   This 58 year old female was admitted for the above surgery.  She will benefit from one more session in acute to review AE and shower transfer. She will have help at home for a week after discharge.      Follow Up Recommendations  No OT follow up    Equipment Recommendations  3 in 1 bedside comode    Recommendations for Other Services       Precautions / Restrictions Precautions Precautions: Knee Restrictions LLE Weight Bearing: Weight bearing as tolerated      Mobility Bed Mobility           Sit to supine: Min assist   General bed mobility comments: asssist for LLE  Transfers   Equipment used: Rolling walker (2 wheeled) Transfers: Sit to/from Stand Sit to Stand: Min assist         General transfer comment: assist to rise, steady and control descent    Balance                                            ADL Overall ADL's : Needs assistance/impaired     Grooming: Oral care;Supervision/safety;Standing   Upper Body Bathing: Set up;Sitting   Lower Body Bathing: Minimal assistance;Sit to/from stand   Upper Body Dressing : Set up;Sitting   Lower Body Dressing: Minimal assistance;Sit to/from stand   Toilet Transfer: Minimal assistance;BSC;Ambulation   Toileting- Water quality scientist and Hygiene: Min guard;Sit to/from stand         General ADL Comments: ambulated to bathroom and completed adl from commode, sit to stand.  Pt nauseas/vomited during session and felt better afterwards.  Returned to bed to rest. Will review shower transfer on next visit     Vision                     Perception     Praxis      Pertinent Vitals/Pain Pain Assessment: No/denies pain     Hand Dominance     Extremity/Trunk Assessment Upper  Extremity Assessment Upper Extremity Assessment: Overall WFL for tasks assessed           Communication Communication Communication: No difficulties   Cognition Arousal/Alertness: Awake/alert Behavior During Therapy: WFL for tasks assessed/performed Overall Cognitive Status: Within Functional Limits for tasks assessed                     General Comments       Exercises       Shoulder Instructions      Home Living Family/patient expects to be discharged to:: Private residence Living Arrangements: Other relatives                 Bathroom Shower/Tub: Occupational psychologist: Standard                Prior Functioning/Environment Level of Independence: Independent             OT Diagnosis: Generalized weakness   OT Problem List: Decreased strength;Decreased activity tolerance;Decreased knowledge of use of DME or AE   OT Treatment/Interventions: Self-care/ADL training;DME and/or AE instruction;Patient/family education    OT Goals(Current goals can be  found in the care plan section) Acute Rehab OT Goals Patient Stated Goal: home OT Goal Formulation: With patient Time For Goal Achievement: 11/04/13 ADL Goals Pt Will Transfer to Toilet: with supervision;bedside commode;ambulating Pt Will Perform Tub/Shower Transfer: with min guard assist;shower seat;Shower transfer Additional ADL Goal #1: pt will verbalize vs demonstrate use of AE for LB adls with set up  OT Frequency: Min 2X/week   Barriers to D/C:            Co-evaluation              End of Session    Activity Tolerance: Patient limited by fatigue Patient left: in bed;with call bell/phone within reach   Time: 0857-0924 OT Time Calculation (min): 27 min Charges:  OT General Charges $OT Visit: 1 Procedure OT Evaluation $Initial OT Evaluation Tier I: 1 Procedure OT Treatments $Self Care/Home Management : 23-37 mins G-Codes:    Casey Larsen November 02, 2013, 9:37  AM

## 2013-10-28 NOTE — Progress Notes (Signed)
Physical Therapy Treatment Patient Details Name: Casey Larsen MRN: [id number] DOB: 1955/12/10 Today's Date: 10/28/2013    History of Present Illness s/p L  TKA     PT Comments    POD # 1 am session held till late morning due to episode of nausea then vomiting reported nursing.  On arrival pt was in bed resting and stated she did feel better now.  Assisted OOB to amb a great distance in hallway with no c/o.  Increased time.  Returned to recliner to perform TKR TE's followed by ICE.    Follow Up Recommendations  Home health PT     Equipment Recommendations  Rolling walker with 5" wheels    Recommendations for Other Services       Precautions / Restrictions Precautions Precautions: Knee Restrictions Weight Bearing Restrictions: No LLE Weight Bearing: Weight bearing as tolerated    Mobility  Bed Mobility Overal bed mobility: Needs Assistance Bed Mobility: Supine to Sit     Supine to sit: Min assist     General bed mobility comments: asssist for LLE plus increased time  Transfers Overall transfer level: Needs assistance Equipment used: Rolling walker (2 wheeled) Transfers: Sit to/from Stand Sit to Stand: Min assist         General transfer comment: assist to rise, steady and control descent  Ambulation/Gait Ambulation/Gait assistance: Min assist Ambulation Distance (Feet): 80 Feet Assistive device: Rolling walker (2 wheeled) Gait Pattern/deviations: Step-to pattern Gait velocity: decreased   General Gait Details: 25% Vc's on proper walker to self distance and safety with turns   Financial trader [employer] (Stroke Patients Only)       Balance                                    Cognition                            Exercises   Total Knee Replacement TE's 10 reps B LE ankle pumps 10 reps towel squeezes 10 reps knee presses 10 reps heel slides  10 reps SAQ's 10 reps SLR's 10 reps  ABD Followed by ICE'     General Comments        Pertinent Vitals/Pain      Home Living                      Prior Function            PT Goals (current goals can now be found in the care plan section) Progress towards PT goals: Progressing toward goals    Frequency  7X/week    PT Plan      Co-evaluation             End of Session Equipment Utilized During Treatment: Gait belt Activity Tolerance: Patient limited by fatigue Patient left: in chair;with call bell/phone within reach     Time: 1157-1223 PT Time Calculation (min): 26 min  Charges:  $Gait Training: 8-22 mins $Therapeutic Exercise: 8-22 mins                    G Codes:      Rica Koyanagi  PTA [facility]  Acute  Rehab Pager      [phone]

## 2013-10-28 NOTE — Progress Notes (Signed)
RN reviewed discharged instructions with patient and family.   Paperwork and prescriptions given.   NT rolled patient down in wheelchair to family vehicle.   Patient discharged with DME and patient set up with [facility] home health care

## 2013-10-28 NOTE — Progress Notes (Signed)
10/28/13 08:00 CM met with pt in room to offer choice for home health agency.  Pt chooses [facility] to render HHPT.  Address and contact information verified with pt.  Pt requests primary contact number be her mobile number.  Referral given to [facility] rep on unit, [facility].  DME to be delivered to room prior to discharge by [place].  No other CM needs were communicated.  Mariane Masters, BSN, CM [phone].

## 2013-10-29 NOTE — Discharge Summary (Signed)
Physician Discharge Summary  Patient ID: Casey Larsen MRN: [id number] DOB/AGE: [birth year] 58 y.o.  Admit date: 10/27/2013 Discharge date: 10/28/2013   Procedures:  Procedure(s) (LRB): LEFT TOTAL KNEE ARTHROPLASTY (Left)  Attending Physician:  Dr. Paralee Cancel   Admission Diagnoses:   Left knee OA / pain  Discharge Diagnoses:  Active Problems:   S/P left TKA   Expected blood loss anemia   Obese  Past Medical History  Diagnosis Date  . Arthritis     knees  . Melanoma 2005    face  . GERD (gastroesophageal reflux disease)   . Hypertension   . Diverticulosis   . Hyperlipidemia   . Pulmonary nodules   . Dermatophytosis of nail [id number]  . Depression     with death of spouse and son  . Migraines     without headache per eye Doctor  . UTI (urinary tract infection)     Completed Cipro Sept 25th from primary care MD    HPI: Casey Larsen, 58 y.o. female, has a history of pain and functional disability in the left knee due to arthritis and has failed non-surgical conservative treatments for greater than 12 weeks to includeNSAID's and/or analgesics, corticosteriod injections and activity modification. Onset of symptoms was gradual, starting 2+ years ago with gradually worsening course since that time. The patient noted prior procedures on the knee to include arthroscopy on the left knee per Dr. Theda Sers. Patient currently rates pain in the left knee(s) at 8 out of 10 with activity. Patient has night pain, worsening of pain with activity and weight bearing, pain that interferes with activities of daily living, pain with passive range of motion, crepitus and joint swelling. Patient has evidence of periarticular osteophytes and joint space narrowing by imaging studies. There is no active infection. Risks, benefits and expectations were discussed with the patient. Risks including but not limited to the risk of anesthesia, blood clots, nerve damage, blood vessel damage, failure of the  prosthesis, infection and up to and including death. Patient understand the risks, benefits and expectations and wishes to proceed with surgery.   PCP: Geoffery Lyons, MD   Discharged Condition: good  Hospital Course:  Patient underwent the above stated procedure on 10/27/2013. Patient tolerated the procedure well and brought to the recovery room in good condition and subsequently to the floor.  POD #1 BP: 131/71 ; Pulse: 67 ; Temp: 97.7 F (36.5 C) ; Resp: 18  Patient reports pain as mild, pain controlled. Started to have some nausea this morning, feels it might have been because of taking meds without food on her stomach. No events otherwise throughout the night. Ready to be discharged home.  Dorsiflexion/plantar flexion intact, incision: dressing C/D/I, no cellulitis present and compartment soft.   LABS  Basename    HGB  10.4  HCT  32.2    Discharge Exam: General appearance: alert, cooperative and no distress Extremities: Homans sign is negative, no sign of DVT, no edema, redness or tenderness in the calves or thighs and no ulcers, gangrene or trophic changes  Disposition: Home with follow up in 2 weeks   Follow-up Information   Follow up with [facility]. (home health physical therapy)    Contact information:   [address] [place] [address] [phone]       Follow up with [facility]. (3n1 (commode) AND rolling walker)    Contact information:   [place]  [id number] [phone]       Follow up with Mauri Pole, MD. Schedule an appointment as soon as possible for a visit in 2 weeks.   Specialty:  Orthopedic Surgery   Contact information:   [address] [place] [address] [phone]       Follow up with [facility]. (home health physical therapy)    Contact information:   [address] [place] [address] [phone]       Discharge  Instructions   Call MD / Call 911    Complete by:  As directed   If you experience chest pain or shortness of breath, CALL 911 and be transported to the hospital emergency room.  If you develope a fever above 101 F, pus (white drainage) or increased drainage or redness at the wound, or calf pain, call your surgeon's office.     Change dressing    Complete by:  As directed   Maintain surgical dressing for 10-14 days, or until follow up in the clinic.     Constipation Prevention    Complete by:  As directed   Drink plenty of fluids.  Prune juice may be helpful.  You may use a stool softener, such as Colace (over the counter) 100 mg twice a day.  Use MiraLax (over the counter) for constipation as needed.     Diet - low sodium heart healthy    Complete by:  As directed      Discharge instructions    Complete by:  As directed   Maintain surgical dressing for 10-14 days, or until follow up in the clinic. Follow up in 2 weeks at [facility]. Call with any questions or concerns.     Increase activity slowly as tolerated    Complete by:  As directed      TED hose    Complete by:  As directed   Use stockings (TED hose) for 2 weeks on both leg(s).  You may remove them at night for sleeping.     Weight bearing as tolerated    Complete by:  As directed   Laterality:  left  Extremity:  Lower             Medication List         aspirin 325 MG EC tablet  Take 1 tablet (325 mg total) by mouth 2 (two) times daily.     BENEFIBER DRINK MIX PO  Take by mouth daily.     celecoxib 200 MG capsule  Commonly known as:  CELEBREX  Take 1 capsule (200 mg total) by mouth every 12 (twelve) hours.     cholecalciferol 1000 UNITS tablet  Commonly known as:  VITAMIN D  Take 1,000 Units by mouth every morning.     DSS 100 MG Caps  Take 100 mg by mouth 2 (two) times daily.     ferrous sulfate 325 (65 FE) MG tablet  Take 1 tablet (325 mg total) by mouth 3 (three) times daily after meals.       glucosamine-chondroitin 500-400 MG tablet  Take 1 tablet by mouth every morning.     HYDROcodone-acetaminophen 7.5-325 MG per tablet  Commonly known as:  NORCO  Take 1-2 tablets by mouth every 4 (four) hours as needed for moderate pain.     lisinopril-hydrochlorothiazide 20-12.5 MG per tablet  Commonly known as:  PRINZIDE,ZESTORETIC  Take 2 tablets by mouth every morning.     methocarbamol 500 MG  tablet  Commonly known as:  ROBAXIN  Take 1 tablet (500 mg total) by mouth every 6 (six) hours as needed for muscle spasms.     omeprazole 20 MG capsule  Commonly known as:  PRILOSEC  Take 20 mg by mouth every morning.     polyethylene glycol packet  Commonly known as:  MIRALAX / GLYCOLAX  Take 17 g by mouth 2 (two) times daily.     promethazine 12.5 MG tablet  Commonly known as:  PHENERGAN  Take 1 tablet (12.5 mg total) by mouth every 6 (six) hours as needed for nausea or vomiting.         Signed: [place] P[place]. Tommaso Cavitt   PA-C  10/29/2013, 8:42 AM

## 2013-12-09 ENCOUNTER — Other Ambulatory Visit: Payer: Self-pay | Admitting: Internal Medicine

## 2013-12-09 DIAGNOSIS — R921 Mammographic calcification found on diagnostic imaging of breast: Secondary | ICD-10-CM

## 2013-12-29 ENCOUNTER — Ambulatory Visit
Admission: RE | Admit: 2013-12-29 | Discharge: 2013-12-29 | Disposition: A | Discharge status: Home / Self Care | Payer: PRIVATE HEALTH INSURANCE | Source: Ambulatory Visit | Attending: Internal Medicine | Admitting: Internal Medicine

## 2013-12-29 DIAGNOSIS — R921 Mammographic calcification found on diagnostic imaging of breast: Secondary | ICD-10-CM

## 2014-09-09 ENCOUNTER — Encounter: Payer: Self-pay | Admitting: Internal Medicine

## 2014-11-25 ENCOUNTER — Other Ambulatory Visit: Payer: Self-pay | Admitting: Internal Medicine

## 2014-11-25 DIAGNOSIS — R921 Mammographic calcification found on diagnostic imaging of breast: Secondary | ICD-10-CM

## 2015-01-05 ENCOUNTER — Ambulatory Visit
Admission: RE | Admit: 2015-01-05 | Discharge: 2015-01-05 | Disposition: A | Discharge status: Home / Self Care | Payer: No Typology Code available for payment source | Source: Ambulatory Visit | Attending: Internal Medicine | Admitting: Internal Medicine

## 2015-01-05 DIAGNOSIS — R921 Mammographic calcification found on diagnostic imaging of breast: Secondary | ICD-10-CM

## 2015-12-01 ENCOUNTER — Other Ambulatory Visit: Payer: Self-pay | Admitting: Internal Medicine

## 2015-12-01 DIAGNOSIS — Z1231 Encounter for screening mammogram for malignant neoplasm of breast: Secondary | ICD-10-CM

## 2015-12-27 ENCOUNTER — Other Ambulatory Visit: Payer: Self-pay | Admitting: Physician Assistant

## 2016-01-02 ENCOUNTER — Ambulatory Visit
Admission: RE | Admit: 2016-01-02 | Discharge: 2016-01-02 | Disposition: A | Discharge status: Home / Self Care | Payer: No Typology Code available for payment source | Source: Ambulatory Visit | Attending: Internal Medicine | Admitting: Internal Medicine

## 2016-01-02 DIAGNOSIS — Z1231 Encounter for screening mammogram for malignant neoplasm of breast: Secondary | ICD-10-CM

## 2016-11-23 ENCOUNTER — Other Ambulatory Visit: Payer: Self-pay | Admitting: Internal Medicine

## 2016-11-23 DIAGNOSIS — Z1231 Encounter for screening mammogram for malignant neoplasm of breast: Secondary | ICD-10-CM

## 2017-01-02 ENCOUNTER — Ambulatory Visit
Admission: RE | Admit: 2017-01-02 | Discharge: 2017-01-02 | Disposition: A | Discharge status: Home / Self Care | Payer: No Typology Code available for payment source | Source: Ambulatory Visit | Attending: Internal Medicine | Admitting: Internal Medicine

## 2017-01-02 DIAGNOSIS — Z1231 Encounter for screening mammogram for malignant neoplasm of breast: Secondary | ICD-10-CM

## 2017-03-01 ENCOUNTER — Encounter: Payer: Self-pay | Admitting: Podiatry

## 2017-03-01 ENCOUNTER — Ambulatory Visit (INDEPENDENT_AMBULATORY_CARE_PROVIDER_SITE_OTHER): Payer: PRIVATE HEALTH INSURANCE | Admitting: Podiatry

## 2017-03-01 ENCOUNTER — Ambulatory Visit (INDEPENDENT_AMBULATORY_CARE_PROVIDER_SITE_OTHER): Payer: PRIVATE HEALTH INSURANCE

## 2017-03-01 DIAGNOSIS — M722 Plantar fascial fibromatosis: Secondary | ICD-10-CM | POA: Diagnosis not present

## 2017-03-01 MED ORDER — DICLOFENAC SODIUM 75 MG PO TBEC: 75.0000 mg | DELAYED_RELEASE_TABLET | Freq: Two times a day (BID) | ORAL | 2 refills | Status: DC

## 2017-03-01 MED ORDER — TRIAMCINOLONE ACETONIDE 10 MG/ML IJ SUSP
10.0000 mg | Freq: Once | INTRAMUSCULAR | Status: AC
  Administered 2017-03-01: 10 mg

## 2017-03-01 NOTE — Patient Instructions (Signed)

## 2017-03-01 NOTE — Progress Notes (Signed)
   Subjective:    Patient ID: Casey Larsen, female    DOB: [birth year], 62 y.o.   MRN: [id number]  HPI    Review of Systems  All other systems reviewed and are negative.      Objective:   Physical Exam        Assessment & Plan:

## 2017-03-01 NOTE — Progress Notes (Signed)
Subjective:   Patient ID: Casey Larsen, female   DOB: 62 y.o.   MRN: [id number]   HPI Patient presents with severe pain plantar aspect left heel at the insertional point of the tendon into the calcaneus with inflammation and fluid buildup around the medial band.  Patient states does not remember specific injury and states it has been somewhat bothersome for a while before then and she did have a history of this and number of years ago.  Patient does not smoke and likes to be active   Review of Systems  All other systems reviewed and are negative.       Objective:  Physical Exam  Constitutional: She appears well-developed and well-nourished.  Cardiovascular: Intact distal pulses.  Pulmonary/Chest: Effort normal.  Musculoskeletal: Normal range of motion.  Neurological: She is alert.  Skin: Skin is warm.  Nursing note and vitals reviewed.   Neurovascular status intact muscle strength adequate range of motion within normal limits with exquisite discomfort plantar aspect heel left at the insertional point of the tendon into the calcaneus with inflammation fluid around the medial band.  Patient is noted to have good digital perfusion and is well oriented x3     Assessment:  Acute plantar fasciitis left with inflammation fluid around the medial band     Plan:  H&P condition reviewed at great length x-ray reviewed and today I injected the plantar fascia 3 mg Kenalog 5 mg Xylocaine applied fascial brace gave instructions on physical therapy and placed on diclofenac 75 mg twice daily.  Reappoint to reevaluate in 2 weeks  X-ray indicates there is small spur with no indication of stress fracture or advanced arthritis

## 2017-03-15 ENCOUNTER — Ambulatory Visit (INDEPENDENT_AMBULATORY_CARE_PROVIDER_SITE_OTHER): Payer: PRIVATE HEALTH INSURANCE | Admitting: Podiatry

## 2017-03-15 ENCOUNTER — Encounter: Payer: Self-pay | Admitting: Podiatry

## 2017-03-15 DIAGNOSIS — M722 Plantar fascial fibromatosis: Secondary | ICD-10-CM | POA: Diagnosis not present

## 2017-03-15 NOTE — Progress Notes (Signed)
Subjective:   Patient ID: Casey Larsen, female   DOB: 62 y.o.   MRN: [id number]   HPI Patient states doing a lot better with discomfort still noted but insignificant improvement from previously.  Patient states that it feels much better   ROS      Objective:  Physical Exam  Neurovascular status intact with inflammation of the plantar heel left that is improved quite dramatically at the current time     Assessment:  Plantar fasciitis left that is improved with minimal discomfort upon palpation     Plan:  H&P and spent a great deal of time going over this condition.  I explained to patient the fasciitis-like condition she is experiencing and went over treatment to be continued consisting of physical therapy shoe gear modification support.  Patient will be seen back to recheck

## 2017-05-01 ENCOUNTER — Other Ambulatory Visit: Payer: Self-pay | Admitting: Physician Assistant

## 2017-10-29 ENCOUNTER — Other Ambulatory Visit (HOSPITAL_COMMUNITY): Payer: Self-pay | Admitting: Specialist

## 2017-10-29 ENCOUNTER — Ambulatory Visit (HOSPITAL_COMMUNITY)
Admission: RE | Admit: 2017-10-29 | Discharge: 2017-10-29 | Disposition: A | Discharge status: Home / Self Care | Payer: No Typology Code available for payment source | Source: Ambulatory Visit | Attending: Cardiovascular Disease | Admitting: Cardiovascular Disease

## 2017-10-29 DIAGNOSIS — R2241 Localized swelling, mass and lump, right lower limb: Secondary | ICD-10-CM | POA: Diagnosis not present

## 2017-10-29 DIAGNOSIS — M25569 Pain in unspecified knee: Secondary | ICD-10-CM | POA: Insufficient documentation

## 2017-12-06 ENCOUNTER — Other Ambulatory Visit: Payer: Self-pay | Admitting: Internal Medicine

## 2017-12-06 DIAGNOSIS — Z1231 Encounter for screening mammogram for malignant neoplasm of breast: Secondary | ICD-10-CM

## 2017-12-09 ENCOUNTER — Ambulatory Visit (INDEPENDENT_AMBULATORY_CARE_PROVIDER_SITE_OTHER): Payer: PRIVATE HEALTH INSURANCE

## 2017-12-09 ENCOUNTER — Ambulatory Visit (INDEPENDENT_AMBULATORY_CARE_PROVIDER_SITE_OTHER): Payer: PRIVATE HEALTH INSURANCE | Admitting: Podiatry

## 2017-12-09 ENCOUNTER — Encounter: Payer: Self-pay | Admitting: Podiatry

## 2017-12-09 DIAGNOSIS — M722 Plantar fascial fibromatosis: Secondary | ICD-10-CM | POA: Diagnosis not present

## 2017-12-09 DIAGNOSIS — I10 Essential (primary) hypertension: Secondary | ICD-10-CM | POA: Insufficient documentation

## 2017-12-09 MED ORDER — TRIAMCINOLONE ACETONIDE 10 MG/ML IJ SUSP
10.0000 mg | Freq: Once | INTRAMUSCULAR | Status: AC
  Administered 2017-12-09: 10 mg

## 2017-12-09 NOTE — Patient Instructions (Signed)

## 2017-12-10 NOTE — Progress Notes (Signed)
Subjective:   Patient ID: Casey Larsen, female   DOB: 62 y.o.   MRN: [id number]   HPI Patient has developed a lot of pain in the bottom of the left heel over the last month and was quite active on her foot and has not been seen by [place] for approximately 9 months   ROS      Objective:  Physical Exam  Neurovascular status intact with exquisite discomfort plantar aspect left at the insertional point tendon into the calcaneus     Assessment:  Acute plantar fasciitis left with inflammation fluid buildup     Plan:  X-ray reviewed and injected the plantar fascial left 3 mg Kenalog 5 mg Xylocaine advised on ice therapy supportive shoes and physical therapy  X-ray indicates small spur but no indications of stress fracture arthritis

## 2018-01-17 ENCOUNTER — Ambulatory Visit
Admission: RE | Admit: 2018-01-17 | Discharge: 2018-01-17 | Disposition: A | Discharge status: Home / Self Care | Payer: No Typology Code available for payment source | Source: Ambulatory Visit | Attending: Internal Medicine | Admitting: Internal Medicine

## 2018-01-17 DIAGNOSIS — Z1231 Encounter for screening mammogram for malignant neoplasm of breast: Secondary | ICD-10-CM

## 2018-01-21 ENCOUNTER — Ambulatory Visit: Payer: No Typology Code available for payment source

## 2018-07-04 NOTE — Progress Notes (Signed)
Left voicemail with Caryl Pina at Dr. Theda Sers' office to request orders in epic

## 2018-07-09 NOTE — H&P (Signed)
TOTAL KNEE ADMISSION H&P  Patient is being admitted for right total knee arthroplasty.  Subjective:  Chief Complaint:right knee pain.  HPI: Casey Larsen, 63 y.o. female, has a history of pain and functional disability in the right knee due to arthritis and has failed non-surgical conservative treatments for greater than 12 weeks to includeNSAID's and/or analgesics, corticosteriod injections, viscosupplementation injections, flexibility and strengthening excercises, supervised PT with diminished ADL's post treatment, use of assistive devices and activity modification.  Onset of symptoms was gradual, starting 10 years ago with gradually worsening course since that time. The patient noted no past surgery on the right knee(s).  Patient currently rates pain in the right knee(s) at 9 out of 10 with activity. Patient has night pain, worsening of pain with activity and weight bearing, pain that interferes with activities of daily living, pain with passive range of motion, crepitus and joint swelling.  Patient has evidence of subchondral cysts, subchondral sclerosis, periarticular osteophytes, joint subluxation and joint space narrowing by imaging studies. . There is no active infection.  Patient Active Problem List   Diagnosis Date Noted  . Hypertensive disorder 12/09/2017  . Knee pain 10/29/2017  . Expected blood loss anemia 10/28/2013  . Obese 10/28/2013  . S/P left TKA 10/27/2013  . Dermatophytosis of nail    Past Medical History:  Diagnosis Date  . Arthritis    knees  . Depression    with death of spouse and son  . Dermatophytosis of nail [id number]  . Diverticulosis   . GERD (gastroesophageal reflux disease)   . Hyperlipidemia   . Hypertension   . Melanoma ([place]) 2005   face  . Migraines    without headache per eye Doctor  . Pulmonary nodules   . UTI (urinary tract infection)    Completed Cipro Sept 25th from primary care MD    Past Surgical History:  Procedure Laterality Date   . ABDOMINAL HYSTERECTOMY  1998  . bone spurs  2010, 2012   both feet  . BREAST BIOPSY Right 2012   right, benign  . JOINT REPLACEMENT  2012   left foot  . KNEE ARTHROSCOPY  2000,2003   right and left  . TOTAL KNEE ARTHROPLASTY Left 10/27/2013   Procedure: LEFT TOTAL KNEE ARTHROPLASTY;  Surgeon: Mauri Pole, MD;  Location: [facility];  Service: Orthopedics;  Laterality: Left;    No current facility-administered medications for this encounter.    Current Outpatient Medications  Medication Sig Dispense Refill Last Dose  . celecoxib (CELEBREX) 200 MG capsule Take 1 capsule (200 mg total) by mouth every 12 (twelve) hours. 60 capsule 0 Taking  . cholecalciferol (VITAMIN D) 1000 UNITS tablet Take 1,000 Units by mouth every morning.   Taking  . diclofenac (VOLTAREN) 75 MG EC tablet Take 1 tablet (75 mg total) by mouth 2 (two) times daily. 50 tablet 2 Taking  . docusate sodium 100 MG CAPS Take 100 mg by mouth 2 (two) times daily. 10 capsule 0 Taking  . ferrous sulfate 325 (65 FE) MG tablet Take 1 tablet (325 mg total) by mouth 3 (three) times daily after meals.  3 Taking  . glucosamine-chondroitin 500-400 MG tablet Take 1 tablet by mouth every morning.    Taking  . HYDROcodone-acetaminophen (NORCO) 7.5-325 MG per tablet Take 1-2 tablets by mouth every 4 (four) hours as needed for moderate pain. 100 tablet 0 Taking  . lisinopril-hydrochlorothiazide (PRINZIDE,ZESTORETIC) 20-12.5 MG per tablet Take 2 tablets by mouth every morning.  Taking  . methocarbamol (ROBAXIN) 500 MG tablet Take 1 tablet (500 mg total) by mouth every 6 (six) hours as needed for muscle spasms. 50 tablet 0 Taking  . omeprazole (PRILOSEC) 20 MG capsule Take 20 mg by mouth every morning.    Taking  . polyethylene glycol (MIRALAX / GLYCOLAX) packet Take 17 g by mouth 2 (two) times daily. 14 each 0 Taking  . promethazine (PHENERGAN) 12.5 MG tablet Take 1 tablet (12.5 mg total) by mouth every 6 (six) hours as needed for nausea or  vomiting. 30 tablet 0 Taking  . Wheat Dextrin (BENEFIBER DRINK MIX PO) Take by mouth daily.   Taking   No Known Allergies  Social History   Tobacco Use  . Smoking status: Former Smoker    Quit date: 05/07/2005    Years since quitting: 13.1  . Smokeless tobacco: Never Used  Substance Use Topics  . Alcohol use: Yes    Comment: occasional    Family History  Problem Relation Age of Onset  . Colon cancer Maternal Grandmother   . Breast cancer Mother   . Diabetes Mellitus I Son      Review of Systems  All other systems reviewed and are negative.   Objective:  Physical Exam  Constitutional: She is oriented to person, place, and time. She appears well-developed and well-nourished.  HENT:  Head: Normocephalic and atraumatic.  Eyes: Pupils are equal, round, and reactive to light. Conjunctivae and EOM are normal.  Neck: Normal range of motion. Neck supple.  Cardiovascular: Normal rate and regular rhythm.  Respiratory: Effort normal and breath sounds normal.  GI: Soft. Bowel sounds are normal.  Musculoskeletal:     Right knee: She exhibits abnormal alignment. She exhibits normal range of motion, no swelling, no effusion, no deformity and no bony tenderness. No tenderness found. No medial joint line and no lateral joint line tenderness noted.  Neurological: She is alert and oriented to person, place, and time.  Skin: Skin is warm and dry.  Psychiatric: She has a normal mood and affect. Her behavior is normal.    Vital signs in last 24 hours:    Labs:   Estimated body mass index is 30.87 kg/m as calculated from the following:   Height as of 10/27/13: 5\' 8"  (1.727 m).   Weight as of 10/27/13: 92.1 kg.   Imaging Review Plain radiographs demonstrate severe degenerative joint disease of the right knee(s). The overall alignment issignificant varus. The bone quality appears to be good for age and reported activity level.      Assessment/Plan:  End stage arthritis, right knee    The patient history, physical examination, clinical judgment of the provider and imaging studies are consistent with end stage degenerative joint disease of the right knee(s) and total knee arthroplasty is deemed medically necessary. The treatment options including medical management, injection therapy arthroscopy and arthroplasty were discussed at length. The risks and benefits of total knee arthroplasty were presented and reviewed. The risks due to aseptic loosening, infection, stiffness, patella tracking problems, thromboembolic complications and other imponderables were discussed. The patient acknowledged the explanation, agreed to proceed with the plan and consent was signed. Patient is being admitted for inpatient treatment for surgery, pain control, PT, OT, prophylactic antibiotics, VTE prophylaxis, progressive ambulation and ADL's and discharge planning. The patient is planning to be discharged home with home health services    Anticipated LOS equal to or greater than 2 midnights due to - Age 73 and older with one  or more of the following:  - Obesity  - Expected need for hospital services (PT, OT, Nursing) required for safe  discharge  - Anticipated need for postoperative skilled nursing care or inpatient rehab  - Active co-morbidities: None

## 2018-07-14 ENCOUNTER — Encounter (HOSPITAL_COMMUNITY): Admission: RE | Admit: 2018-07-14 | Payer: No Typology Code available for payment source | Source: Ambulatory Visit

## 2018-07-14 NOTE — Patient Instructions (Addendum)
Casey Larsen  07/14/2018   Your procedure is scheduled on: Friday 07/18/2018  Report to [facility] Main  Entrance             Report to short stay   0530  AM              YOU NEED TO HAVE A COVID 19 TEST ON_______ @_______ , THIS TEST MUST BE DONE BEFORE SURGERY, COME TO [place]. ONCE                       YOUR COVID TEST IS COMPLETED, PLEASE BEGIN THE QUARANTINE INSTRUCTIONS AS OUTLINED IN YOUR HANDOUT.   Call this number if you have problems the morning of surgery [phone]    Remember: Do not eat food :After Midnight.               NO SOLID FOOD AFTER MIDNIGHT THE NIGHT PRIOR TO SURGERY. NOTHING BY MOUTH EXCEPT CLEAR LIQUIDS UNTIL 3 HOURS PRIOR TO [place] SURGERY.             PLEASE FINISH ENSURE DRINK PER SURGEON ORDER 3 HOURS PRIOR TO SCHEDULED SURGERY TIME WHICH NEEDS TO BE COMPLETED AT    0430 am.   CLEAR LIQUID DIET   Foods Allowed                                                                     Foods Excluded  Coffee and tea, regular and decaf                             liquids that you cannot  Plain Jell-O in any flavor                                             see through such as: Fruit ices (not with fruit pulp)                                     milk, soups, orange juice  Iced Popsicles                                    All solid food Carbonated beverages, regular and diet                                    Cranberry, grape and apple juices Sports drinks like Gatorade Lightly seasoned clear broth or consume(fat free) Sugar, honey syrup  Sample Menu Breakfast                                Lunch  Supper Cranberry juice                    Beef broth                            Chicken broth Jell-O                                     Grape juice                           Apple juice Coffee or tea                        Jell-O                                       Popsicle                                                Coffee or tea                        Coffee or tea  _____________________________________________________________________               BRUSH YOUR TEETH MORNING OF SURGERY AND RINSE YOUR MOUTH OUT, NO CHEWING GUM CANDY OR MINTS.     Take these medicines the morning of surgery with A SIP OF WATER: Omeprazole (Prilosec)                                You may not have any metal on your body including hair pins and              piercings  Do not wear jewelry, make-up, lotions, powders or perfumes, deodorant             Do not wear nail polish.  Do not shave  48 hours prior to surgery.                Do not bring valuables to the hospital. [place].  Contacts, dentures or bridgework may not be worn into surgery.                    Please read over the following fact sheets you were given: _____________________________________________________________________             [facility] - Preparing for Surgery Before surgery, you can play an important role.  Because skin is not sterile, your skin needs to be as free of germs as possible.  You can reduce the number of germs on your skin by washing with CHG (chlorahexidine gluconate) soap before surgery.  CHG is an antiseptic cleaner which kills germs and bonds with the skin to continue killing germs even after washing. Please DO NOT use if you have an allergy to CHG or antibacterial soaps.  If your skin becomes reddened/irritated stop using the  CHG and inform your nurse when you arrive at Short Stay. Do not shave (including legs and underarms) for at least 48 hours prior to the first CHG shower.  You may shave your face/neck. Please follow these instructions carefully:  1.  Shower with CHG Soap the night before surgery and the  morning of Surgery.  2.  If you choose to wash your hair, wash your hair first as usual with your  normal   shampoo.  3.  After you shampoo, rinse your hair and body thoroughly to remove the  shampoo.                           4.  Use CHG as you would any other liquid soap.  You can apply chg directly  to the skin and wash                       Gently with a scrungie or clean washcloth.  5.  Apply the CHG Soap to your body ONLY FROM THE NECK DOWN.   Do not use on face/ open                           Wound or open sores. Avoid contact with eyes, ears mouth and genitals (private parts).                       Wash face,  Genitals (private parts) with your normal soap.             6.  Wash thoroughly, paying special attention to the area where your surgery  will be performed.  7.  Thoroughly rinse your body with warm water from the neck down.  8.  DO NOT shower/wash with your normal soap after using and rinsing off  the CHG Soap.                9.  Pat yourself dry with a clean towel.            10.  Wear clean pajamas.            11.  Place clean sheets on your bed the night of your first shower and do not  sleep with pets. Day of Surgery : Do not apply any lotions/deodorants the morning of surgery.  Please wear clean clothes to the hospital/surgery center.  FAILURE TO FOLLOW THESE INSTRUCTIONS MAY RESULT IN THE CANCELLATION OF YOUR SURGERY PATIENT SIGNATURE_________________________________  NURSE SIGNATURE__________________________________  ________________________________________________________________________   Adam Phenix  An incentive spirometer is a tool that can help keep your lungs clear and active. This tool measures how well you are filling your lungs with each breath. Taking long deep breaths may help reverse or decrease the chance of developing breathing (pulmonary) problems (especially infection) following:  A long period of time when you are unable to move or be active. BEFORE THE PROCEDURE   If the spirometer includes an indicator to show your best effort, your nurse or  respiratory therapist will set it to a desired goal.  If possible, sit up straight or lean slightly forward. Try not to slouch.  Hold the incentive spirometer in an upright position. INSTRUCTIONS FOR USE  1. Sit on the edge of your bed if possible, or sit up as far as you can in bed or on a chair. 2. Hold the incentive spirometer in  an upright position. 3. Breathe out normally. 4. Place the mouthpiece in your mouth and seal your lips tightly around it. 5. Breathe in slowly and as deeply as possible, raising the piston or the ball toward the top of the column. 6. Hold your breath for 3-5 seconds or for as long as possible. Allow the piston or ball to fall to the bottom of the column. 7. Remove the mouthpiece from your mouth and breathe out normally. 8. Rest for a few seconds and repeat Steps 1 through 7 at least 10 times every 1-2 hours when you are awake. Take your time and take a few normal breaths between deep breaths. 9. The spirometer may include an indicator to show your best effort. Use the indicator as a goal to work toward during each repetition. 10. After each set of 10 deep breaths, practice coughing to be sure your lungs are clear. If you have an incision (the cut made at the time of surgery), support your incision when coughing by placing a pillow or rolled up towels firmly against it. Once you are able to get out of bed, walk around indoors and cough well. You may stop using the incentive spirometer when instructed by your caregiver.  RISKS AND COMPLICATIONS  Take your time so you do not get dizzy or light-headed.  If you are in pain, you may need to take or ask for pain medication before doing incentive spirometry. It is harder to take a deep breath if you are having pain. AFTER USE  Rest and breathe slowly and easily.  It can be helpful to keep track of a log of your progress. Your caregiver can provide you with a simple table to help with this. If you are using the  spirometer at home, follow these instructions: [place] IF:   You are having difficultly using the spirometer.  You have trouble using the spirometer as often as instructed.  Your pain medication is not giving enough relief while using the spirometer.  You develop fever of 100.5 F (38.1 C) or higher. SEEK IMMEDIATE MEDICAL CARE IF:   You cough up bloody sputum that had not been present before.  You develop fever of 102 F (38.9 C) or greater.  You develop worsening pain at or near the incision site. MAKE SURE YOU:   Understand these instructions.  Will watch your condition.  Will get help right away if you are not doing well or get worse. Document Released: 05/21/2006 Document Revised: 04/02/2011 Document Reviewed: 07/22/2006 ExitCare Patient Information 2014 ExitCare, [place].   ________________________________________________________________________  WHAT IS A BLOOD TRANSFUSION? Blood Transfusion Information  A transfusion is the replacement of blood or some of its parts. Blood is made up of multiple cells which provide different functions.  Red blood cells carry oxygen and are used for blood loss replacement.  White blood cells fight against infection.  Platelets control bleeding.  Plasma helps clot blood.  Other blood products are available for specialized needs, such as hemophilia or other clotting disorders. BEFORE THE TRANSFUSION  Who gives blood for transfusions?   Healthy volunteers who are fully evaluated to make sure their blood is safe. This is blood bank blood. Transfusion therapy is the safest it has ever been in the practice of medicine. Before blood is taken from a donor, a complete history is taken to make sure that person has no history of diseases nor engages in risky social behavior (examples are intravenous drug use or sexual activity with  multiple partners). The donor's travel history is screened to minimize risk of transmitting  infections, such as malaria. The donated blood is tested for signs of infectious diseases, such as HIV and hepatitis. The blood is then tested to be sure it is compatible with you in order to minimize the chance of a transfusion reaction. If you or a relative donates blood, this is often done in anticipation of surgery and is not appropriate for emergency situations. It takes many days to process the donated blood. RISKS AND COMPLICATIONS Although transfusion therapy is very safe and saves many lives, the main dangers of transfusion include:   Getting an infectious disease.  Developing a transfusion reaction. This is an allergic reaction to something in the blood you were given. Every precaution is taken to prevent this. The decision to have a blood transfusion has been considered carefully by your caregiver before blood is given. Blood is not given unless the benefits outweigh the risks. AFTER THE TRANSFUSION  Right after receiving a blood transfusion, you will usually feel much better and more energetic. This is especially true if your red blood cells have gotten low (anemic). The transfusion raises the level of the red blood cells which carry oxygen, and this usually causes an energy increase.  The nurse administering the transfusion will monitor you carefully for complications. HOME CARE INSTRUCTIONS  No special instructions are needed after a transfusion. You may find your energy is better. Speak with your caregiver about any limitations on activity for underlying diseases you may have. SEEK MEDICAL CARE IF:   Your condition is not improving after your transfusion.  You develop redness or irritation at the intravenous (IV) site. SEEK IMMEDIATE MEDICAL CARE IF:  Any of the following symptoms occur over the next 12 hours:  Shaking chills.  You have a temperature by mouth above 102 F (38.9 C), not controlled by medicine.  Chest, back, or muscle pain.  People around you feel you are  not acting correctly or are confused.  Shortness of breath or difficulty breathing.  Dizziness and fainting.  You get a rash or develop hives.  You have a decrease in urine output.  Your urine turns a dark color or changes to pink, red, or brown. Any of the following symptoms occur over the next 10 days:  You have a temperature by mouth above 102 F (38.9 C), not controlled by medicine.  Shortness of breath.  Weakness after normal activity.  The white part of the eye turns yellow (jaundice).  You have a decrease in the amount of urine or are urinating less often.  Your urine turns a dark color or changes to pink, red, or brown. Document Released: 01/06/2000 Document Revised: 04/02/2011 Document Reviewed: 08/25/2007 [facility] Patient Information 2014 [place], [place].  _______________________________________________________________________

## 2018-07-15 ENCOUNTER — Other Ambulatory Visit: Payer: Self-pay

## 2018-07-15 ENCOUNTER — Encounter (HOSPITAL_COMMUNITY): Payer: Self-pay

## 2018-07-15 ENCOUNTER — Other Ambulatory Visit (HOSPITAL_COMMUNITY)
Admission: RE | Admit: 2018-07-15 | Discharge: 2018-07-15 | Disposition: A | Discharge status: Home / Self Care | Payer: Commercial Managed Care - PPO | Source: Ambulatory Visit | Attending: Specialist | Admitting: Specialist

## 2018-07-15 ENCOUNTER — Encounter (HOSPITAL_COMMUNITY)
Admission: RE | Admit: 2018-07-15 | Discharge: 2018-07-15 | Disposition: A | Discharge status: Home / Self Care | Payer: Commercial Managed Care - PPO | Source: Ambulatory Visit | Attending: Specialist | Admitting: Specialist

## 2018-07-15 DIAGNOSIS — I10 Essential (primary) hypertension: Secondary | ICD-10-CM | POA: Diagnosis not present

## 2018-07-15 DIAGNOSIS — Z85820 Personal history of malignant melanoma of skin: Secondary | ICD-10-CM | POA: Diagnosis not present

## 2018-07-15 DIAGNOSIS — Z96698 Presence of other orthopedic joint implants: Secondary | ICD-10-CM | POA: Diagnosis not present

## 2018-07-15 DIAGNOSIS — Z79899 Other long term (current) drug therapy: Secondary | ICD-10-CM | POA: Diagnosis not present

## 2018-07-15 DIAGNOSIS — Z6835 Body mass index (BMI) 35.0-35.9, adult: Secondary | ICD-10-CM | POA: Diagnosis not present

## 2018-07-15 DIAGNOSIS — Z791 Long term (current) use of non-steroidal anti-inflammatories (NSAID): Secondary | ICD-10-CM | POA: Diagnosis not present

## 2018-07-15 DIAGNOSIS — Z96652 Presence of left artificial knee joint: Secondary | ICD-10-CM | POA: Diagnosis not present

## 2018-07-15 DIAGNOSIS — E669 Obesity, unspecified: Secondary | ICD-10-CM | POA: Diagnosis not present

## 2018-07-15 DIAGNOSIS — Z1159 Encounter for screening for other viral diseases: Secondary | ICD-10-CM | POA: Diagnosis present

## 2018-07-15 DIAGNOSIS — M1711 Unilateral primary osteoarthritis, right knee: Secondary | ICD-10-CM | POA: Diagnosis not present

## 2018-07-15 DIAGNOSIS — K219 Gastro-esophageal reflux disease without esophagitis: Secondary | ICD-10-CM | POA: Diagnosis not present

## 2018-07-15 DIAGNOSIS — Z87891 Personal history of nicotine dependence: Secondary | ICD-10-CM | POA: Diagnosis not present

## 2018-07-15 DIAGNOSIS — Z01818 Encounter for other preprocedural examination: Secondary | ICD-10-CM | POA: Insufficient documentation

## 2018-07-15 HISTORY — DX: Pneumonia, unspecified organism: J18.9

## 2018-07-15 HISTORY — DX: Other specified postprocedural states: Z98.890

## 2018-07-15 HISTORY — DX: Other specified postprocedural states: R11.2

## 2018-07-15 LAB — SURGICAL PCR SCREEN
MRSA, PCR: NEGATIVE
Staphylococcus aureus: POSITIVE — AB

## 2018-07-15 LAB — CBC
HCT: 40 % (ref 36.0–46.0)
Hemoglobin: 12.9 g/dL (ref 12.0–15.0)
MCH: 30.1 pg (ref 26.0–34.0)
MCHC: 32.3 g/dL (ref 30.0–36.0)
MCV: 93.2 fL (ref 80.0–100.0)
Platelets: 296 10*3/uL (ref 150–400)
RBC: 4.29 MIL/uL (ref 3.87–5.11)
RDW: 12.5 % (ref 11.5–15.5)
WBC: 8.3 10*3/uL (ref 4.0–10.5)
nRBC: 0 % (ref 0.0–0.2)

## 2018-07-15 LAB — BASIC METABOLIC PANEL
Anion gap: 10 (ref 5–15)
BUN: 28 mg/dL — ABNORMAL HIGH (ref 8–23)
CO2: 24 mmol/L (ref 22–32)
Calcium: 9.1 mg/dL (ref 8.9–10.3)
Chloride: 104 mmol/L (ref 98–111)
Creatinine, Ser: 0.81 mg/dL (ref 0.44–1.00)
GFR calc Af Amer: >60 mL/min (ref >60)
GFR calc non Af Amer: >60 mL/min (ref >60)
Glucose, Bld: 130 mg/dL — ABNORMAL HIGH (ref 70–99)
Potassium: 3.5 mmol/L (ref 3.5–5.1)
Sodium: 138 mmol/L (ref 135–145)

## 2018-07-15 LAB — SARS CORONAVIRUS 2 (TAT 6-24 HRS): SARS Coronavirus 2: NEGATIVE

## 2018-07-17 MED ORDER — BUPIVACAINE LIPOSOME 1.3 % IJ SUSP
20.0000 mL | Freq: Once | INTRAMUSCULAR | Status: DC
  Filled 2018-07-17: qty 20

## 2018-07-18 ENCOUNTER — Encounter (HOSPITAL_COMMUNITY): Payer: Self-pay | Admitting: *Deleted

## 2018-07-18 ENCOUNTER — Observation Stay (HOSPITAL_COMMUNITY)
Admission: RE | Admit: 2018-07-18 | Discharge: 2018-07-20 | Disposition: A | Discharge status: Home / Self Care | Payer: Commercial Managed Care - PPO | Attending: Specialist | Admitting: Specialist

## 2018-07-18 ENCOUNTER — Ambulatory Visit (HOSPITAL_COMMUNITY): Payer: Commercial Managed Care - PPO | Admitting: Physician Assistant

## 2018-07-18 ENCOUNTER — Encounter (HOSPITAL_COMMUNITY)
Admission: RE | Disposition: A | Discharge status: Home / Self Care | Payer: Self-pay | Source: Home / Self Care | Attending: Specialist

## 2018-07-18 ENCOUNTER — Ambulatory Visit (HOSPITAL_COMMUNITY): Payer: Commercial Managed Care - PPO | Admitting: Registered Nurse

## 2018-07-18 ENCOUNTER — Other Ambulatory Visit: Payer: Self-pay

## 2018-07-18 DIAGNOSIS — I10 Essential (primary) hypertension: Secondary | ICD-10-CM | POA: Insufficient documentation

## 2018-07-18 DIAGNOSIS — Z791 Long term (current) use of non-steroidal anti-inflammatories (NSAID): Secondary | ICD-10-CM | POA: Insufficient documentation

## 2018-07-18 DIAGNOSIS — Z85820 Personal history of malignant melanoma of skin: Secondary | ICD-10-CM | POA: Insufficient documentation

## 2018-07-18 DIAGNOSIS — E669 Obesity, unspecified: Secondary | ICD-10-CM | POA: Insufficient documentation

## 2018-07-18 DIAGNOSIS — M1711 Unilateral primary osteoarthritis, right knee: Principal | ICD-10-CM

## 2018-07-18 DIAGNOSIS — Z6835 Body mass index (BMI) 35.0-35.9, adult: Secondary | ICD-10-CM | POA: Insufficient documentation

## 2018-07-18 DIAGNOSIS — Z96651 Presence of right artificial knee joint: Secondary | ICD-10-CM

## 2018-07-18 DIAGNOSIS — Z79899 Other long term (current) drug therapy: Secondary | ICD-10-CM | POA: Insufficient documentation

## 2018-07-18 DIAGNOSIS — Z96652 Presence of left artificial knee joint: Secondary | ICD-10-CM | POA: Insufficient documentation

## 2018-07-18 DIAGNOSIS — K219 Gastro-esophageal reflux disease without esophagitis: Secondary | ICD-10-CM | POA: Insufficient documentation

## 2018-07-18 DIAGNOSIS — Z96698 Presence of other orthopedic joint implants: Secondary | ICD-10-CM | POA: Insufficient documentation

## 2018-07-18 DIAGNOSIS — Z87891 Personal history of nicotine dependence: Secondary | ICD-10-CM | POA: Insufficient documentation

## 2018-07-18 HISTORY — PX: TOTAL KNEE ARTHROPLASTY: SHX125

## 2018-07-18 LAB — CBC
HCT: 41.6 % (ref 36.0–46.0)
Hemoglobin: 13.5 g/dL (ref 12.0–15.0)
MCH: 30.5 pg (ref 26.0–34.0)
MCHC: 32.5 g/dL (ref 30.0–36.0)
MCV: 93.9 fL (ref 80.0–100.0)
Platelets: 283 10*3/uL (ref 150–400)
RBC: 4.43 MIL/uL (ref 3.87–5.11)
RDW: 12.3 % (ref 11.5–15.5)
WBC: 9.8 10*3/uL (ref 4.0–10.5)
nRBC: 0 % (ref 0.0–0.2)

## 2018-07-18 LAB — CREATININE, SERUM
Creatinine, Ser: 0.76 mg/dL (ref 0.44–1.00)
GFR calc Af Amer: >60 mL/min (ref >60)
GFR calc non Af Amer: >60 mL/min (ref >60)

## 2018-07-18 SURGERY — ARTHROPLASTY, KNEE, TOTAL
Anesthesia: Spinal | Laterality: Right

## 2018-07-18 MED ORDER — METHOCARBAMOL 500 MG IVPB - SIMPLE MED
500.0000 mg | Freq: Four times a day (QID) | INTRAVENOUS | Status: DC | PRN
  Filled 2018-07-18: qty 50

## 2018-07-18 MED ORDER — BUPIVACAINE IN DEXTROSE 0.75-8.25 % IT SOLN
INTRATHECAL | Status: DC | PRN
  Administered 2018-07-18: 2 mL via INTRATHECAL

## 2018-07-18 MED ORDER — MORPHINE SULFATE (PF) 2 MG/ML IV SOLN
0.5000 mg | INTRAVENOUS | Status: DC | PRN
  Administered 2018-07-18: 1 mg via INTRAVENOUS
  Filled 2018-07-18: qty 1

## 2018-07-18 MED ORDER — FENTANYL CITRATE (PF) 100 MCG/2ML IJ SOLN
INTRAMUSCULAR | Status: AC
  Filled 2018-07-18: qty 2

## 2018-07-18 MED ORDER — VITAMIN D 25 MCG (1000 UNIT) PO TABS
2000.0000 [IU] | ORAL_TABLET | Freq: Every day | ORAL | Status: DC
  Administered 2018-07-19 – 2018-07-20 (×2): 2000 [IU] via ORAL
  Filled 2018-07-18 (×2): qty 2

## 2018-07-18 MED ORDER — PROPOFOL 10 MG/ML IV BOLUS
INTRAVENOUS | Status: AC
  Filled 2018-07-18: qty 20

## 2018-07-18 MED ORDER — ASPIRIN EC 325 MG PO TBEC: 325.0000 mg | DELAYED_RELEASE_TABLET | Freq: Two times a day (BID) | ORAL | 11 refills | Status: DC

## 2018-07-18 MED ORDER — DEXTROSE-NACL 5-0.45 % IV SOLN
INTRAVENOUS | Status: DC
  Administered 2018-07-18 – 2018-07-19 (×2): via INTRAVENOUS

## 2018-07-18 MED ORDER — METHOCARBAMOL 500 MG PO TABS: 500.0000 mg | ORAL_TABLET | Freq: Four times a day (QID) | ORAL | 0 refills | Status: DC

## 2018-07-18 MED ORDER — HYDROCHLOROTHIAZIDE 12.5 MG PO CAPS
12.5000 mg | ORAL_CAPSULE | Freq: Every day | ORAL | Status: DC
  Administered 2018-07-19 – 2018-07-20 (×2): 12.5 mg via ORAL
  Filled 2018-07-18 (×2): qty 1

## 2018-07-18 MED ORDER — ONDANSETRON HCL 4 MG/2ML IJ SOLN
INTRAMUSCULAR | Status: DC | PRN
  Administered 2018-07-18: 4 mg via INTRAVENOUS

## 2018-07-18 MED ORDER — ONDANSETRON HCL 4 MG/2ML IJ SOLN: 4.0000 mg | Freq: Once | INTRAMUSCULAR | Status: DC | PRN

## 2018-07-18 MED ORDER — STERILE WATER FOR IRRIGATION IR SOLN
Status: DC | PRN
  Administered 2018-07-18: 2000 mL

## 2018-07-18 MED ORDER — ONDANSETRON HCL 4 MG/2ML IJ SOLN: 4.0000 mg | Freq: Four times a day (QID) | INTRAMUSCULAR | Status: DC | PRN

## 2018-07-18 MED ORDER — MEPERIDINE HCL 50 MG/ML IJ SOLN: 6.2500 mg | INTRAMUSCULAR | Status: DC | PRN

## 2018-07-18 MED ORDER — PROPOFOL 10 MG/ML IV BOLUS
INTRAVENOUS | Status: DC | PRN
  Administered 2018-07-18: 10 mg via INTRAVENOUS
  Administered 2018-07-18: 30 mg via INTRAVENOUS

## 2018-07-18 MED ORDER — DEXAMETHASONE SODIUM PHOSPHATE 10 MG/ML IJ SOLN
INTRAMUSCULAR | Status: AC
  Filled 2018-07-18: qty 1

## 2018-07-18 MED ORDER — PHENOL 1.4 % MT LIQD: 1.0000 | OROMUCOSAL | Status: DC | PRN

## 2018-07-18 MED ORDER — PHENYLEPHRINE HCL (PRESSORS) 10 MG/ML IV SOLN
INTRAVENOUS | Status: AC
  Filled 2018-07-18: qty 1

## 2018-07-18 MED ORDER — MORPHINE SULFATE (PF) 4 MG/ML IV SOLN: 0.5000 mg | INTRAVENOUS | Status: DC | PRN

## 2018-07-18 MED ORDER — POLYETHYLENE GLYCOL 3350 17 G PO PACK
17.0000 g | PACK | Freq: Every day | ORAL | Status: DC | PRN
  Administered 2018-07-20: 17 g via ORAL
  Filled 2018-07-18: qty 1

## 2018-07-18 MED ORDER — METOCLOPRAMIDE HCL 5 MG/ML IJ SOLN: 5.0000 mg | Freq: Three times a day (TID) | INTRAMUSCULAR | Status: DC | PRN

## 2018-07-18 MED ORDER — BISACODYL 5 MG PO TBEC: 5.0000 mg | DELAYED_RELEASE_TABLET | Freq: Every day | ORAL | Status: DC | PRN

## 2018-07-18 MED ORDER — HYDROCODONE-ACETAMINOPHEN 5-325 MG PO TABS
1.0000 | ORAL_TABLET | ORAL | Status: DC | PRN
  Administered 2018-07-18 – 2018-07-19 (×3): 2 via ORAL
  Filled 2018-07-18 (×3): qty 2

## 2018-07-18 MED ORDER — 0.9 % SODIUM CHLORIDE (POUR BTL) OPTIME
TOPICAL | Status: DC | PRN
  Administered 2018-07-18: 1000 mL

## 2018-07-18 MED ORDER — BUPIVACAINE LIPOSOME 1.3 % IJ SUSP
INTRAMUSCULAR | Status: DC | PRN
  Administered 2018-07-18: 20 mL

## 2018-07-18 MED ORDER — MENTHOL 3 MG MT LOZG: 1.0000 | LOZENGE | OROMUCOSAL | Status: DC | PRN

## 2018-07-18 MED ORDER — DOCUSATE SODIUM 100 MG PO CAPS
100.0000 mg | ORAL_CAPSULE | Freq: Two times a day (BID) | ORAL | Status: DC
  Administered 2018-07-18 – 2018-07-20 (×4): 100 mg via ORAL
  Filled 2018-07-18 (×4): qty 1

## 2018-07-18 MED ORDER — HYDROMORPHONE HCL 1 MG/ML IJ SOLN: 0.2500 mg | INTRAMUSCULAR | Status: DC | PRN

## 2018-07-18 MED ORDER — OXYCODONE HCL 5 MG PO TABS: 5.0000 mg | ORAL_TABLET | ORAL | 0 refills | Status: DC | PRN

## 2018-07-18 MED ORDER — MIDAZOLAM HCL 2 MG/2ML IJ SOLN
INTRAMUSCULAR | Status: AC
  Filled 2018-07-18: qty 2

## 2018-07-18 MED ORDER — HYDROCODONE-ACETAMINOPHEN 7.5-325 MG PO TABS
1.0000 | ORAL_TABLET | ORAL | Status: DC | PRN
  Administered 2018-07-18 – 2018-07-20 (×7): 2 via ORAL
  Filled 2018-07-18 (×7): qty 2

## 2018-07-18 MED ORDER — POVIDONE-IODINE 10 % EX SWAB
2.0000 "application " | Freq: Once | CUTANEOUS | Status: AC
  Administered 2018-07-18: 2 via TOPICAL

## 2018-07-18 MED ORDER — PANTOPRAZOLE SODIUM 40 MG PO TBEC
40.0000 mg | DELAYED_RELEASE_TABLET | Freq: Every day | ORAL | Status: DC
  Administered 2018-07-19 – 2018-07-20 (×2): 40 mg via ORAL
  Filled 2018-07-18 (×2): qty 1

## 2018-07-18 MED ORDER — LACTATED RINGERS IV SOLN
INTRAVENOUS | Status: DC
  Administered 2018-07-18 (×3): via INTRAVENOUS

## 2018-07-18 MED ORDER — PROPOFOL 10 MG/ML IV BOLUS
INTRAVENOUS | Status: AC
  Filled 2018-07-18: qty 60

## 2018-07-18 MED ORDER — GLYCOPYRROLATE PF 0.2 MG/ML IJ SOSY
PREFILLED_SYRINGE | INTRAMUSCULAR | Status: AC
  Filled 2018-07-18: qty 1

## 2018-07-18 MED ORDER — FLEET ENEMA 7-19 GM/118ML RE ENEM: 1.0000 | ENEMA | Freq: Once | RECTAL | Status: DC | PRN

## 2018-07-18 MED ORDER — SODIUM CHLORIDE (PF) 0.9 % IJ SOLN
INTRAMUSCULAR | Status: AC
  Filled 2018-07-18: qty 10

## 2018-07-18 MED ORDER — MIDAZOLAM HCL 5 MG/5ML IJ SOLN
INTRAMUSCULAR | Status: DC | PRN
  Administered 2018-07-18: 1 mg via INTRAVENOUS
  Administered 2018-07-18: 2 mg via INTRAVENOUS

## 2018-07-18 MED ORDER — LISINOPRIL-HYDROCHLOROTHIAZIDE 20-12.5 MG PO TABS: 2.0000 | ORAL_TABLET | ORAL | Status: DC

## 2018-07-18 MED ORDER — SODIUM CHLORIDE 0.9 % IV SOLN
INTRAVENOUS | Status: DC | PRN
  Administered 2018-07-18: 25 ug/min via INTRAVENOUS

## 2018-07-18 MED ORDER — CEFAZOLIN SODIUM-DEXTROSE 1-4 GM/50ML-% IV SOLN
1.0000 g | Freq: Four times a day (QID) | INTRAVENOUS | Status: AC
  Administered 2018-07-18 (×2): 1 g via INTRAVENOUS
  Filled 2018-07-18 (×2): qty 50

## 2018-07-18 MED ORDER — ACETAMINOPHEN 500 MG PO TABS
500.0000 mg | ORAL_TABLET | Freq: Four times a day (QID) | ORAL | Status: AC
  Administered 2018-07-19 (×2): 500 mg via ORAL
  Filled 2018-07-18 (×3): qty 1

## 2018-07-18 MED ORDER — PROPOFOL 500 MG/50ML IV EMUL
INTRAVENOUS | Status: DC | PRN
  Administered 2018-07-18: 75 ug/kg/min via INTRAVENOUS

## 2018-07-18 MED ORDER — DIPHENHYDRAMINE HCL 12.5 MG/5ML PO ELIX: 12.5000 mg | ORAL_SOLUTION | ORAL | Status: DC | PRN

## 2018-07-18 MED ORDER — TRANEXAMIC ACID-NACL 1000-0.7 MG/100ML-% IV SOLN
1000.0000 mg | INTRAVENOUS | Status: AC
  Administered 2018-07-18: 1000 mg via INTRAVENOUS
  Filled 2018-07-18: qty 100

## 2018-07-18 MED ORDER — ONDANSETRON HCL 4 MG PO TABS
4.0000 mg | ORAL_TABLET | Freq: Four times a day (QID) | ORAL | Status: DC | PRN
  Filled 2018-07-18: qty 1

## 2018-07-18 MED ORDER — FENTANYL CITRATE (PF) 100 MCG/2ML IJ SOLN
INTRAMUSCULAR | Status: DC | PRN
  Administered 2018-07-18 (×4): 50 ug via INTRAVENOUS

## 2018-07-18 MED ORDER — ACETAMINOPHEN 325 MG PO TABS: 325.0000 mg | ORAL_TABLET | Freq: Four times a day (QID) | ORAL | Status: DC | PRN

## 2018-07-18 MED ORDER — CEFAZOLIN SODIUM-DEXTROSE 2-4 GM/100ML-% IV SOLN
2.0000 g | INTRAVENOUS | Status: AC
  Administered 2018-07-18: 2 g via INTRAVENOUS
  Filled 2018-07-18: qty 100

## 2018-07-18 MED ORDER — ONDANSETRON HCL 4 MG/2ML IJ SOLN
INTRAMUSCULAR | Status: AC
  Filled 2018-07-18: qty 2

## 2018-07-18 MED ORDER — POVIDONE-IODINE 7.5 % EX SOLN: Freq: Once | CUTANEOUS | Status: DC

## 2018-07-18 MED ORDER — ENOXAPARIN SODIUM 30 MG/0.3ML ~~LOC~~ SOLN
30.0000 mg | Freq: Two times a day (BID) | SUBCUTANEOUS | Status: DC
  Administered 2018-07-19 – 2018-07-20 (×3): 30 mg via SUBCUTANEOUS
  Filled 2018-07-18 (×3): qty 0.3

## 2018-07-18 MED ORDER — ZOLPIDEM TARTRATE 5 MG PO TABS: 5.0000 mg | ORAL_TABLET | Freq: Every evening | ORAL | Status: DC | PRN

## 2018-07-18 MED ORDER — SODIUM CHLORIDE (PF) 0.9 % IJ SOLN
INTRAMUSCULAR | Status: DC | PRN
  Administered 2018-07-18: 60 mL

## 2018-07-18 MED ORDER — SODIUM CHLORIDE 0.9 % IR SOLN
Status: DC | PRN
  Administered 2018-07-18: 3000 mL

## 2018-07-18 MED ORDER — DEXAMETHASONE SODIUM PHOSPHATE 10 MG/ML IJ SOLN
8.0000 mg | Freq: Once | INTRAMUSCULAR | Status: AC
  Administered 2018-07-18: 10 mg via INTRAVENOUS

## 2018-07-18 MED ORDER — LISINOPRIL 20 MG PO TABS
20.0000 mg | ORAL_TABLET | Freq: Every day | ORAL | Status: DC
  Administered 2018-07-19 – 2018-07-20 (×2): 20 mg via ORAL
  Filled 2018-07-18 (×2): qty 1

## 2018-07-18 MED ORDER — SODIUM CHLORIDE (PF) 0.9 % IJ SOLN
INTRAMUSCULAR | Status: AC
  Filled 2018-07-18: qty 50

## 2018-07-18 MED ORDER — METOCLOPRAMIDE HCL 5 MG PO TABS: 5.0000 mg | ORAL_TABLET | Freq: Three times a day (TID) | ORAL | Status: DC | PRN

## 2018-07-18 MED ORDER — METHOCARBAMOL 500 MG PO TABS
500.0000 mg | ORAL_TABLET | Freq: Four times a day (QID) | ORAL | Status: DC | PRN
  Administered 2018-07-18 – 2018-07-20 (×6): 500 mg via ORAL
  Filled 2018-07-18 (×6): qty 1

## 2018-07-18 SURGICAL SUPPLY — 67 items
ADH SKN CLS APL DERMABOND .7 (GAUZE/BANDAGES/DRESSINGS) ×1
ATTUNE PSFEM RTSZ5 NARCEM KNEE (Femur) ×2 IMPLANT
ATTUNE PSRP INSR SZ5 5 KNEE (Insert) ×1 IMPLANT
ATTUNE PSRP INSR SZ5 5MM KNEE (Insert) ×1 IMPLANT
BAG DECANTER FOR FLEXI CONT (MISCELLANEOUS) IMPLANT
BAG SPEC THK2 15X12 ZIP CLS (MISCELLANEOUS) ×1
BAG ZIPLOCK 12X15 (MISCELLANEOUS) ×4 IMPLANT
BANDAGE ACE 4X5 VEL STRL LF (GAUZE/BANDAGES/DRESSINGS) ×5 IMPLANT
BANDAGE ACE 6X5 VEL STRL LF (GAUZE/BANDAGES/DRESSINGS) ×5 IMPLANT
BASE TIBIAL ROT PLAT SZ 5 KNEE (Knees) IMPLANT
BLADE SAG 18X100X1.27 (BLADE) ×3 IMPLANT
BLADE SAW SGTL 11.0X1.19X90.0M (BLADE) ×3 IMPLANT
BOWL SMART MIX CTS (DISPOSABLE) ×3 IMPLANT
BSPLAT TIB 5 CMNT ROT PLAT STR (Knees) ×1 IMPLANT
CEMENT HV SMART SET (Cement) ×4 IMPLANT
COVER SURGICAL LIGHT HANDLE (MISCELLANEOUS) ×3 IMPLANT
COVER WAND RF STERILE (DRAPES) IMPLANT
CUFF TOURN SGL QUICK 34 (TOURNIQUET CUFF) ×3
CUFF TRNQT CYL 34X4.125X (TOURNIQUET CUFF) ×1 IMPLANT
DECANTER SPIKE VIAL GLASS SM (MISCELLANEOUS) ×3 IMPLANT
DERMABOND ADVANCED (GAUZE/BANDAGES/DRESSINGS) ×2
DERMABOND ADVANCED .7 DNX12 (GAUZE/BANDAGES/DRESSINGS) ×1 IMPLANT
DRAPE U-SHAPE 47X51 STRL (DRAPES) ×3 IMPLANT
DRSG AQUACEL AG ADV 3.5X10 (GAUZE/BANDAGES/DRESSINGS) ×3 IMPLANT
DRSG TEGADERM 4X4.75 (GAUZE/BANDAGES/DRESSINGS) ×3 IMPLANT
DURAPREP 26ML APPLICATOR (WOUND CARE) ×6 IMPLANT
ELECT REM PT RETURN 15FT ADLT (MISCELLANEOUS) ×3 IMPLANT
EVACUATOR 1/8 PVC DRAIN (DRAIN) ×3 IMPLANT
GAUZE SPONGE 2X2 8PLY STRL LF (GAUZE/BANDAGES/DRESSINGS) ×1 IMPLANT
GLOVE BIO SURGEON STRL SZ7.5 (GLOVE) ×6 IMPLANT
GLOVE BIOGEL PI IND STRL 8 (GLOVE) ×2 IMPLANT
GLOVE BIOGEL PI INDICATOR 8 (GLOVE) ×4
GLOVE ECLIPSE 8.0 STRL XLNG CF (GLOVE) ×6 IMPLANT
GLOVE SURG ORTHO 9.0 STRL STRW (GLOVE) ×3 IMPLANT
GOWN STRL REUS W/TWL XL LVL3 (GOWN DISPOSABLE) ×6 IMPLANT
HANDPIECE INTERPULSE COAX TIP (DISPOSABLE) ×3
HOLDER FOLEY CATH W/STRAP (MISCELLANEOUS) IMPLANT
KIT TURNOVER KIT A (KITS) IMPLANT
NS IRRIG 1000ML POUR BTL (IV SOLUTION) ×3 IMPLANT
PACK TOTAL KNEE CUSTOM (KITS) ×3 IMPLANT
PATELLA MEDIAL ATTUN 35MM KNEE (Knees) ×2 IMPLANT
PIN STEINMAN FIXATION KNEE (PIN) ×2 IMPLANT
PIN THREADED HEADED SIGMA (PIN) ×2 IMPLANT
PROTECTOR NERVE ULNAR (MISCELLANEOUS) ×3 IMPLANT
SET HNDPC FAN SPRY TIP SCT (DISPOSABLE) ×1 IMPLANT
SET PAD KNEE POSITIONER (MISCELLANEOUS) ×3 IMPLANT
SPONGE GAUZE 2X2 STER 10/PKG (GAUZE/BANDAGES/DRESSINGS) ×2
SPONGE LAP 18X18 RF (DISPOSABLE) IMPLANT
SPONGE SURGIFOAM ABS GEL 100 (HEMOSTASIS) ×3 IMPLANT
STOCKINETTE 6  STRL (DRAPES) ×2
STOCKINETTE 6 STRL (DRAPES) ×1 IMPLANT
SUCTION FRAZIER HANDLE 12FR (TUBING) ×2
SUCTION TUBE FRAZIER 12FR DISP (TUBING) IMPLANT
SUT BONE WAX W31G (SUTURE) IMPLANT
SUT MNCRL AB 3-0 PS2 18 (SUTURE) ×3 IMPLANT
SUT VIC AB 1 CT1 27 (SUTURE) ×9
SUT VIC AB 1 CT1 27XBRD ANTBC (SUTURE) ×3 IMPLANT
SUT VIC AB 2-0 CT1 27 (SUTURE) ×6
SUT VIC AB 2-0 CT1 TAPERPNT 27 (SUTURE) ×2 IMPLANT
SUT VLOC 180 0 24IN GS25 (SUTURE) ×3 IMPLANT
SYR 50ML LL SCALE MARK (SYRINGE) IMPLANT
TAPE STRIPS DRAPE STRL (GAUZE/BANDAGES/DRESSINGS) ×3 IMPLANT
TIBIAL BASE ROT PLAT SZ 5 KNEE (Knees) ×3 IMPLANT
TRAY FOLEY MTR SLVR 16FR STAT (SET/KITS/TRAYS/PACK) ×3 IMPLANT
WATER STERILE IRR 1000ML POUR (IV SOLUTION) ×6 IMPLANT
WRAP KNEE MAXI GEL POST OP (GAUZE/BANDAGES/DRESSINGS) ×3 IMPLANT
YANKAUER SUCT BULB TIP 10FT TU (MISCELLANEOUS) ×3 IMPLANT

## 2018-07-18 NOTE — Interval H&P Note (Signed)
History and Physical Interval Note:  07/18/2018 7:37 AM  Casey Larsen  has presented today for surgery, with the diagnosis of Right knee ostroarthritis.  The various methods of treatment have been discussed with the patient and family. After consideration of risks, benefits and other options for treatment, the patient has consented to  Procedure(s) with comments: TOTAL KNEE ARTHROPLASTY (Right) - adductor canal block as a surgical intervention.  The patient's history has been reviewed, patient examined, no change in status, stable for surgery.  I have reviewed the patient's chart and labs.  Questions were answered to the patient's satisfaction.     Mando Blatz ANDREW

## 2018-07-18 NOTE — Anesthesia Procedure Notes (Signed)
Date/Time: 07/18/2018 7:40 AM Performed by: Lissa Morales, CRNA Pre-anesthesia Checklist: Patient identified, Emergency Drugs available, Suction available, Patient being monitored and Timeout performed Patient Re-evaluated:Patient Re-evaluated prior to induction Oxygen Delivery Method: Simple face mask Placement Confirmation: positive ETCO2

## 2018-07-18 NOTE — Discharge Summary (Signed)
Physician Discharge Summary  Patient ID: Casey Larsen MRN: [id number] DOB/AGE: 1955-12-11 63 y.o.  Admit date: 07/18/2018 Discharge date: 07/20/18  Admission Diagnoses: Right knee ostroarthritis Past Medical History:  Diagnosis Date  . Arthritis    knees  . Depression    with death of spouse and son  . Dermatophytosis of nail [id number]  . Diverticulosis   . GERD (gastroesophageal reflux disease)   . Hyperlipidemia   . Hypertension   . Melanoma ([place]) 2005   face  . Pneumonia   . PONV (postoperative nausea and vomiting)   . Pulmonary nodules   . UTI (urinary tract infection)    Completed Cipro Sept 25th from primary care MD    Discharge Diagnoses:  Active Problems:   S/P total knee replacement, right   Osteoarthritis of right knee   Surgeries: Procedure(s): TOTAL KNEE ARTHROPLASTY on 07/18/2018    Consultants:   Discharged Condition: Improved  Hospital Course: Casey Larsen is an 63 y.o. female who was admitted 07/18/2018 with a chief complaint of No chief complaint on file. , and found to have a diagnosis of Right knee ostroarthritis.  They were brought to the operating room on 07/18/2018 and underwent Procedure(s): TOTAL KNEE ARTHROPLASTY.    They were given perioperative antibiotics:  Anti-infectives (From admission, onward)   Start     Dose/Rate Route Frequency Ordered Stop   07/18/18 0600  ceFAZolin (ANCEF) IVPB 2g/100 mL premix     2 g 200 mL/hr over 30 Minutes Intravenous On call to O.R. 07/18/18 7035 07/18/18 0750    .  They were given sequential compression devices, early ambulation, and Other (comment) Aspirin for DVT prophylaxis.  Recent vital signs:  Patient Vitals for the past 24 hrs:  BP Temp Temp src Pulse Resp SpO2 Height Weight  07/18/18 1045 (!) 109/94 97.8 F (36.6 C) - 73 14 100 % - -  07/18/18 0558 - - - - - - 5\' 7"  (1.702 m) 102.1 kg  07/18/18 0546 (!) 150/89 98.7 F (37.1 C) Oral 82 18 97 % - -  .  Recent laboratory studies: No  results found.  Discharge Medications:   Allergies as of 07/18/2018   No Known Allergies     Medication List    TAKE these medications   Aleve PM 220-25 MG Tabs Generic drug: Naproxen Sod-diphenhydrAMINE Take 1-2 tablets by mouth at bedtime.   aspirin EC 325 MG tablet Take 1 tablet (325 mg total) by mouth 2 (two) times daily.   GLUCOSAMINE-CHONDROITIN PO Take 1 tablet by mouth daily.   lisinopril-hydrochlorothiazide 20-12.5 MG tablet Commonly known as: ZESTORETIC Take 2 tablets by mouth every morning.   methocarbamol 500 MG tablet Commonly known as: Robaxin Take 1 tablet (500 mg total) by mouth 4 (four) times daily.   naproxen sodium 220 MG tablet Commonly known as: ALEVE Take 220-440 mg by mouth daily as needed (pain).   omeprazole 20 MG capsule Commonly known as: PRILOSEC Take 20 mg by mouth every morning.   oxyCODONE 5 MG immediate release tablet Commonly known as: Roxicodone Take 1 tablet (5 mg total) by mouth every 4 (four) hours as needed for up to 7 days.   Vitamin D 50 MCG (2000 UT) tablet Take 2,000 Units by mouth daily.       Diagnostic Studies: No results found.  They benefited maximally from their hospital stay and there were no complications.     Disposition:      Signed: Brynda Peon 07/18/2018,  10:56 AM

## 2018-07-18 NOTE — Transfer of Care (Signed)
Immediate Anesthesia Transfer of Care Note  Patient: Casey Larsen  Procedure(s) Performed: TOTAL KNEE ARTHROPLASTY (Right )  Patient Location: PACU  Anesthesia Type:Spinal  Level of Consciousness: awake, drowsy and patient cooperative  Airway & Oxygen Therapy: Patient Spontanous Breathing and Patient connected to face mask oxygen  Post-op Assessment: Report given to RN and Post -op Vital signs reviewed and stable  Post vital signs: stable  Last Vitals:  Vitals Value Taken Time  BP 109/94 07/18/18 1045  Temp    Pulse 74 07/18/18 1047  Resp 15 07/18/18 1047  SpO2 100 % 07/18/18 1047  Vitals shown include unvalidated device data.  Last Pain:  Vitals:   07/18/18 0558  TempSrc:   PainSc: 3       Patients Stated Pain Goal: 4 (36/62/94 [id number])  Complications: No apparent anesthesia complications

## 2018-07-18 NOTE — Progress Notes (Signed)
Physical Therapy Evaluation Patient Details Name: Casey Larsen MRN: [id number] DOB: [birth year] Today's Date: 07/18/2018   History of Present Illness  s/p R TKA; PMH: L TKA  Clinical Impression  Pt is s/p TKA resulting in the deficits listed below (see PT Problem List).  Pt  With some slight numbness on standing,  Able to transition to chair, should continue to progress well.  Pt will benefit from skilled PT to increase their independence and safety with mobility to allow discharge to the venue listed below.      Follow Up Recommendations Follow surgeon's recommendation for DC plan and follow-up therapies    Equipment Recommendations  None recommended by PT    Recommendations for Other Services       Precautions / Restrictions Precautions Precautions: Fall;Knee Restrictions Other Position/Activity Restrictions: WBAT      Mobility  Bed Mobility Overal bed mobility: Needs Assistance Bed Mobility: Supine to Sit     Supine to sit: Min assist     General bed mobility comments: assist with RLE  Transfers Overall transfer level: Needs assistance Equipment used: Rolling walker (2 wheeled) Transfers: Sit to/from [facility] Sit to Stand: Min assist Stand pivot transfers: Min assist       General transfer comment: cues for hand placement, assist balance and pivot to chair,   cues for sequence and safety  Ambulation/Gait                Stairs            Wheelchair Mobility    Modified Rankin (Stroke Patients Only)       Balance                                             Pertinent Vitals/Pain Pain Assessment: 0-10 Pain Score: 3  Pain Location: R knee Pain Descriptors / Indicators: Aching;Sore Pain Intervention(s): Premedicated before session;Repositioned;Monitored during session;Limited activity within patient's tolerance    Home Living Family/patient expects to be discharged to:: Private residence Living  Arrangements: Other relatives(sister)   Type of Home: House Home Access: Level entry     Home Layout: One level Home Equipment: Environmental consultant - 2 wheels      Prior Function Level of Independence: Independent               Hand Dominance        Extremity/Trunk Assessment   Upper Extremity Assessment Upper Extremity Assessment: Overall WFL for tasks assessed    Lower Extremity Assessment Lower Extremity Assessment: RLE deficits/detail RLE Deficits / Details: ankle WFL; knee extension and hip flexion       Communication   Communication: No difficulties  Cognition Arousal/Alertness: Awake/alert Behavior During Therapy: WFL for tasks assessed/performed Overall Cognitive Status: Within Functional Limits for tasks assessed                                        General Comments      Exercises Total Joint Exercises Ankle Circles/Pumps: AROM;Both;10 reps Quad Sets: AROM;Both;10 reps   Assessment/Plan    PT Assessment Patient needs continued PT services  PT Problem List Decreased strength;Decreased range of motion;Decreased activity tolerance;Decreased mobility;Decreased knowledge of use of DME;Pain       PT Treatment Interventions DME instruction;Therapeutic activities;Gait training;Functional  mobility training;Therapeutic exercise;Patient/family education    PT Goals (Current goals can be found in the Care Plan section)  Acute Rehab PT Goals PT Goal Formulation: With patient Time For Goal Achievement: 07/24/18 Potential to Achieve Goals: Good    Frequency 7X/week   Barriers to discharge        Co-evaluation               AM-PAC PT "6 Clicks" Mobility  Outcome Measure Help needed turning from your back to your side while in a flat bed without using bedrails?: A Little   Help needed moving to and from a bed to a chair (including a wheelchair)?: A Little Help needed standing up from a chair using your arms (e.g., wheelchair or bedside  chair)?: A Little Help needed to walk in hospital room?: A Lot Help needed climbing 3-5 steps with a railing? : A Lot 6 Click Score: 13    End of Session Equipment Utilized During Treatment: Gait belt Activity Tolerance: Patient tolerated treatment well Patient left: with call bell/phone within reach;in bed Nurse Communication: Mobility status PT Visit Diagnosis: Difficulty in walking, not elsewhere classified (R26.2)    Time: 1405(&1215-1225)-1423 PT Time Calculation (min) (ACUTE ONLY): 18 min   Charges:   PT Evaluation $PT Eval Low Complexity: 1 Low PT Treatments $Therapeutic Activity: 8-22 mins        Kenyon Ana, PT  Pager: [phone] Acute Rehab Dept [facility]): [phone]   07/18/2018   [facility] 07/18/2018, 2:51 PM

## 2018-07-18 NOTE — Op Note (Signed)
DATE OF SURGERY:  07/18/2018  TIME: 10:13 AM  PATIENT NAME:  Casey Larsen    AGE: 63 y.o.   PRE-OPERATIVE DIAGNOSIS:  Right knee ostroarthritis  POST-OPERATIVE DIAGNOSIS:  Right knee ostroarthritis  PROCEDURE:  Procedure(s): TOTAL KNEE ARTHROPLASTY  SURGEON:  Teruo Stilley ANDREW  ASSISTANT:  Samantha Bonham, PA-C, present and scrubbed throughout the case, critical for assistance with exposure, retraction, instrumentation, and closure.  OPERATIVE IMPLANTS: Depuy PFC Sigma Rotating Platform.  Femur size 5, Tibia size 5, Patella size 32 3-peg oval button, with a 5 mm polyethylene insert.   PREOPERATIVE INDICATIONS:   Casey Larsen is a 63 y.o. year old female with end stage bone on bone arthritis of the knee who failed conservative treatment and elected for Total Knee Arthroplasty.   The risks, benefits, and alternatives were discussed at length including but not limited to the risks of infection, bleeding, nerve injury, stiffness, blood clots, the need for revision surgery, cardiopulmonary complications, among others, and they were willing to proceed.  OPERATIVE DESCRIPTION:  The patient was brought to the operative room and placed in a supine position.  Spinal anesthesia was administered.  IV antibiotics were given.  The lower extremity was prepped and draped in the usual sterile fashion.  Time out was performed.  The leg was elevated and exsanguinated and the tourniquet was inflated.  Anterior quadriceps tendon splitting approach was performed.  The patella was retracted and osteophytes were removed.  The anterior horn of the medial and lateral meniscus was removed and cruciate ligaments resected.   The distal femur was opened with the drill and the intramedullary distal femoral cutting jig was utilized, set at 5 degrees resecting 10 mm off the distal femur.  Care was taken to protect the collateral ligaments.  The distal femoral sizing jig was applied, taking care to  avoid notching.  Then the 4-in-1 cutting jig was applied and the anterior and posterior femur was cut, along with the chamfer cuts.    Then the extramedullary tibial cutting jig was utilized making the appropriate cut using the anterior tibial crest as a reference building in appropriate posterior slope.  Care was taken during the cut to protect the medial and collateral ligaments.  The proximal tibia was removed along with the posterior horns of the menisci.   The posterior medial femoral osteophytes and posterior lateral femoral osteophytes were removed.    The flexion gap was then measured and was symmetric with the extension gap, measured at 5.  I completed the distal femoral preparation using the appropriate jig to prepare the box.  The patella was then measured, and cut with the saw.    The proximal tibia sized and prepared accordingly with the reamer and the punch, and then all components were trialed with the trial insert.  The knee was found to have excellent balance and full motion.    The above named components were then cemented into place and all excess cement was removed.  The trial polyethylene component was in place during cementation, and then was exchanged for the real polyethylene component.    The knee was easily taken through a range of motion and the patella tracked well and the knee irrigated copiously and the parapatellar and subcutaneous tissue closed with vicryl, and monocryl with steri strips for the skin.  The arthrotomy was closed at 90 of flexion. The wounds were dressed with sterile gauze and the tourniquet released and the patient was awakened and returned to the PACU in  stable and satisfactory condition.  There were no complications.  Total tourniquet time was 70 minutes.

## 2018-07-18 NOTE — Anesthesia Procedure Notes (Signed)
Spinal  Patient location during procedure: OR Start time: 07/18/2018 7:45 AM End time: 07/18/2018 7:48 AM Staffing Anesthesiologist: Lillia Abed, MD Performed: anesthesiologist  Preanesthetic Checklist Completed: patient identified, surgical consent, pre-op evaluation, timeout performed, IV checked, risks and benefits discussed and monitors and equipment checked Spinal Block Patient position: sitting Prep: ChloraPrep Patient monitoring: heart rate, cardiac monitor, continuous pulse ox and blood pressure Approach: right paramedian Location: L3-4 Injection technique: single-shot Needle Needle type: Pencan  Needle gauge: 24 G Needle length: 9 cm Needle insertion depth: 8 cm

## 2018-07-18 NOTE — Anesthesia Postprocedure Evaluation (Signed)
Anesthesia Post Note  Patient: Casey Larsen  Procedure(s) Performed: TOTAL KNEE ARTHROPLASTY (Right )     Patient location during evaluation: SICU Anesthesia Type: Spinal Level of consciousness: sedated Pain management: pain level controlled Vital Signs Assessment: post-procedure vital signs reviewed and stable Respiratory status: patient remains intubated per anesthesia plan Cardiovascular status: stable Postop Assessment: no apparent nausea or vomiting Anesthetic complications: no    Last Vitals:  Vitals:   07/18/18 1115 07/18/18 1130  BP: (!) 157/77 (!) 163/87  Pulse: 60 68  Resp: 13 16  Temp:    SpO2: 100% 99%    Last Pain:  Vitals:   07/18/18 1115  TempSrc:   PainSc: 0-No pain                 Harlei Lehrmann DAVID

## 2018-07-18 NOTE — Anesthesia Preprocedure Evaluation (Signed)
Anesthesia Evaluation  Patient identified by MRN, date of birth, ID band Patient awake    Reviewed: Allergy & Precautions, NPO status , Patient's Chart, lab work & pertinent test results  History of Anesthesia Complications (+) PONV  Airway Mallampati: I  TM Distance: >3 FB Neck ROM: Full    Dental   Pulmonary former smoker,    Pulmonary exam normal        Cardiovascular hypertension, Pt. on medications Normal cardiovascular exam     Neuro/Psych Depression    GI/Hepatic GERD  Medicated and Controlled,  Endo/Other    Renal/GU      Musculoskeletal   Abdominal   Peds  Hematology   Anesthesia Other Findings   Reproductive/Obstetrics                             Anesthesia Physical Anesthesia Plan  ASA: II  Anesthesia Plan: Spinal   Post-op Pain Management:  Regional for Post-op pain   Induction: Intravenous  PONV Risk Score and Plan: 3 and Ondansetron, Midazolam and Treatment may vary due to age or medical condition  Airway Management Planned: Simple Face Mask  Additional Equipment:   Intra-op Plan:   Post-operative Plan:   Informed Consent: I have reviewed the patients History and Physical, chart, labs and discussed the procedure including the risks, benefits and alternatives for the proposed anesthesia with the patient or authorized representative who has indicated his/her understanding and acceptance.       Plan Discussed with: CRNA and Surgeon  Anesthesia Plan Comments:         Anesthesia Quick Evaluation

## 2018-07-18 NOTE — Discharge Instructions (Signed)

## 2018-07-19 DIAGNOSIS — M1711 Unilateral primary osteoarthritis, right knee: Secondary | ICD-10-CM | POA: Diagnosis not present

## 2018-07-19 NOTE — Progress Notes (Signed)
   Subjective: 1 Day Post-Op Procedure(s) (LRB): TOTAL KNEE ARTHROPLASTY (Right)  Pain is mild this morning Denies any new symptoms or issues Ready for therapy today Patient reports pain as mild.  Objective:   VITALS:   Vitals:   07/19/18 0034 07/19/18 0508  BP: 129/70 (!) 158/95  Pulse: 75 72  Resp: 14 18  Temp: 97.8 F (36.6 C) 98.3 F (36.8 C)  SpO2: 95% 99%    Right knee dressing intact  No signs of drainage Drain pulled nv intact distally  LABS Recent Labs    07/18/18 1216  HGB 13.5  HCT 41.6  WBC 9.8  PLT 283    Recent Labs    07/18/18 1216  CREATININE 0.76     Assessment/Plan: 1 Day Post-Op Procedure(s) (LRB): TOTAL KNEE ARTHROPLASTY (Right) PT/OT today Probable d/c tomorrow Pain management Pulmonary toilet    Brad Luna [place], MPAS [facility] is now [facility]  [place] [address] [place], [place], [place] [address] Phone: [phone] [email or web address] Facebook  [employer]

## 2018-07-19 NOTE — TOC Initial Note (Signed)
Transition of Care [facility]) - Initial/Assessment Note    Patient Details  Name: Casey Larsen MRN: [id number] Date of Birth: [birth year]  [facility] CM/SW Contact:    Joaquin Courts, RN Phone Number: 07/19/2018, 10:39 AM  Clinical Narrative:    CM spoke with patient at bedside. Patient reports she has an appointment for OPPT on Wednesday 07/23/18.  Patient states she has a rolling walker at home requests 3-in-1. [employer] setup to deliver 3-in-1 to bedside for home use.                  Expected Discharge Plan: Home/Self Care Barriers to Discharge: Continued Medical Work up   Patient Goals and CMS Choice Patient states their goals for this hospitalization and ongoing recovery are:: to go home      Expected Discharge Plan and Services Expected Discharge Plan: Home/Self Care   Discharge Planning Services: CM Consult   Living arrangements for the past 2 months: Single Family Home                 DME Arranged: 3-N-1 DME Agency: [employer] Date DME Agency Contacted: 07/19/18 Time DME Agency Contacted: 1039 Representative spoke with at DME Agency: [place]: NA [place] Agency: NA        Prior Living Arrangements/Services Living arrangements for the past 2 months: [place] with:: Self Patient language and need for interpreter reviewed:: Yes Do you feel safe going back to the place where you live?: Yes      Need for Family Participation in Patient Care: Yes (Comment) Care giver support system in place?: Yes (comment)   Criminal Activity/Legal Involvement Pertinent to Current Situation/Hospitalization: No - Comment as needed  Activities of Daily Living Home Assistive Devices/Equipment: Eyeglasses ADL Screening (condition at time of admission) Patient's cognitive ability adequate to safely complete daily activities?: Yes Is the patient deaf or have difficulty hearing?: No Does the patient have difficulty seeing, even when wearing  glasses/contacts?: No Does the patient have difficulty concentrating, remembering, or making decisions?: No Patient able to express need for assistance with ADLs?: Yes Does the patient have difficulty dressing or bathing?: No Independently performs ADLs?: Yes (appropriate for developmental age) Does the patient have difficulty walking or climbing stairs?: No Weakness of Legs: Right Weakness of Arms/Hands: None  Permission Sought/Granted                  Emotional Assessment Appearance:: Appears stated age Attitude/Demeanor/Rapport: Engaged Affect (typically observed): Accepting Orientation: : Oriented to Self, Oriented to Place, Oriented to  Time, Oriented to Situation   Psych Involvement: No (comment)  Admission diagnosis:  Right knee ostroarthritis Patient Active Problem List   Diagnosis Date Noted  . S/P total knee replacement, right 07/18/2018  . Osteoarthritis of right knee 07/18/2018  . Hypertensive disorder 12/09/2017  . Knee pain 10/29/2017  . Expected blood loss anemia 10/28/2013  . Obese 10/28/2013  . S/P left TKA 10/27/2013  . Dermatophytosis of nail    PCP:  Burnard Bunting, MD Pharmacy:   [id number] - [place], [place] [address] Phone: [phone] Fax: [phone]     Social Determinants of Health (SDOH) Interventions    Readmission Risk Interventions No flowsheet data found.

## 2018-07-19 NOTE — Plan of Care (Signed)

## 2018-07-19 NOTE — Progress Notes (Signed)
Physical Therapy Treatment Patient Details Name: Casey Larsen MRN: [id number] DOB: Jun 27, 1955 Today's Date: 07/19/2018    History of Present Illness s/p R TKA; PMH: L TKA    PT Comments    Pt progressing toward PT goals, incr amb distance today. Pt still reporting occasional waves of nausea, rN aware. Continue PT POC  Follow Up Recommendations  Follow surgeon's recommendation for DC plan and follow-up therapies     Equipment Recommendations  None recommended by PT    Recommendations for Other Services       Precautions / Restrictions Precautions Precautions: Fall;Knee Restrictions Weight Bearing Restrictions: No Other Position/Activity Restrictions: WBAT    Mobility  Bed Mobility Overal bed mobility: Needs Assistance Bed Mobility: Sit to Supine       Sit to supine: Min assist   General bed mobility comments: assist with RLE  Transfers Overall transfer level: Needs assistance Equipment used: Rolling walker (2 wheeled) Transfers: Sit to/from Stand Sit to Stand: Min guard         General transfer comment: cues for hand placement and RLE position  Ambulation/Gait Ambulation/Gait assistance: Min guard Gait Distance (Feet): 110 Feet(15' more) Assistive device: Rolling walker (2 wheeled) Gait Pattern/deviations: Step-to pattern;Decreased weight shift to right     General Gait Details: cues for sequence and RW position   Stairs             Wheelchair Mobility    Modified Rankin (Stroke Patients Only)       Balance                                            Cognition Arousal/Alertness: Awake/alert Behavior During Therapy: WFL for tasks assessed/performed Overall Cognitive Status: Within Functional Limits for tasks assessed                                        Exercises Total Joint Exercises Ankle Circles/Pumps: AROM;Both;10 reps Quad Sets: AROM;Both;10 reps    General Comments         Pertinent Vitals/Pain Pain Assessment: 0-10 Pain Score: 3  Pain Location: R knee Pain Descriptors / Indicators: Aching;Sore Pain Intervention(s): Limited activity within patient's tolerance;Monitored during session;Premedicated before session;Repositioned;Ice applied    Home Living                      Prior Function            PT Goals (current goals can now be found in the care plan section) Acute Rehab PT Goals PT Goal Formulation: With patient Time For Goal Achievement: 07/24/18 Potential to Achieve Goals: Good Progress towards PT goals: Progressing toward goals    Frequency    7X/week      PT Plan Current plan remains appropriate    Co-evaluation              AM-PAC PT "6 Clicks" Mobility   Outcome Measure  Help needed turning from your back to your side while in a flat bed without using bedrails?: A Little Help needed moving from lying on your back to sitting on the side of a flat bed without using bedrails?: A Little Help needed moving to and from a bed to a chair (including a wheelchair)?: A Little Help needed standing up  from a chair using your arms (e.g., wheelchair or bedside chair)?: A Little Help needed to walk in hospital room?: A Little Help needed climbing 3-5 steps with a railing? : A Little 6 Click Score: 18    End of Session Equipment Utilized During Treatment: Gait belt Activity Tolerance: Patient tolerated treatment well Patient left: in bed;with call bell/phone within reach Nurse Communication: Mobility status PT Visit Diagnosis: Difficulty in walking, not elsewhere classified (R26.2)     Time: 5701-7793 PT Time Calculation (min) (ACUTE ONLY): 24 min  Charges:  $Gait Training: 23-37 mins                     Kenyon Ana, PT  Pager: [phone] Acute Rehab Dept [facility]): [phone]   07/19/2018    [facility] 07/19/2018, 1:34 PM

## 2018-07-19 NOTE — Progress Notes (Signed)
   07/19/18 1721  PT Visit Information  Last PT Received On 07/19/18  Pt continues to progress well. Will likely be ready for d/c after PT tomorrow  Assistance Needed +1  History of Present Illness s/p R TKA; PMH: L TKA  Precautions  Precautions Fall;Knee  Restrictions  Weight Bearing Restrictions No  Other Position/Activity Restrictions WBAT  Pain Assessment  Pain Location R knee  Pain Descriptors / Indicators Aching;Sore  Cognition  Arousal/Alertness Awake/alert  Behavior During Therapy WFL for tasks assessed/performed  Overall Cognitive Status Within Functional Limits for tasks assessed  Bed Mobility  Overal bed mobility Needs Assistance  Bed Mobility Sit to Supine;Supine to Sit  Supine to sit Min guard  Sit to supine Min guard  General bed mobility comments instructed in use of gait belt to self assist   Transfers  Overall transfer level Needs assistance  Equipment used Rolling walker (2 wheeled)  Transfers Sit to/from Stand  Sit to Stand Min guard;Supervision  General transfer comment cues for hand placement and RLE position  Ambulation/Gait  Ambulation/Gait assistance Min guard;Supervision  Gait Distance (Feet) 200 Feet  Assistive device Rolling walker (2 wheeled)  Gait Pattern/deviations Step-to pattern;Decreased weight shift to right  General Gait Details cues for sequence and RW position, improved wt shift to RLE  Total Joint Exercises  Ankle Circles/Pumps AROM;Both;10 reps  Quad Sets AROM;Both;10 reps  Short Arc [employer];Right;10 reps  Heel Slides AAROM;Right;10 reps  Hip ABduction/ADduction AAROM;Right;10 reps  Straight Leg Raises AAROM;Right;10 reps  Goniometric ROM grossly 10* to 60* AAROM right knee flexion  PT - End of Session  Equipment Utilized During Treatment Gait belt  Activity Tolerance Patient tolerated treatment well  Patient left in bed;with call bell/phone within reach  Nurse Communication Mobility status   PT - Assessment/Plan  PT Plan  Current plan remains appropriate  PT Visit Diagnosis Difficulty in walking, not elsewhere classified (R26.2)  PT Frequency (ACUTE ONLY) 7X/week  Follow Up Recommendations Follow surgeon's recommendation for DC plan and follow-up therapies  PT equipment None recommended by PT  AM-PAC PT "6 Clicks" Mobility Outcome Measure (Version 2)  Help needed turning from your back to your side while in a flat bed without using bedrails? 3  Help needed moving from lying on your back to sitting on the side of a flat bed without using bedrails? 3  Help needed moving to and from a bed to a chair (including a wheelchair)? 3  Help needed standing up from a chair using your arms (e.g., wheelchair or bedside chair)? 3  Help needed to walk in hospital room? 3  Help needed climbing 3-5 steps with a railing?  3  6 Click Score 18  Consider Recommendation of Discharge To: Home with [facility]  PT Goal Progression  Progress towards PT goals Progressing toward goals  Acute Rehab PT Goals  PT Goal Formulation With patient  Time For Goal Achievement 07/24/18  Potential to Achieve Goals Good  PT Time Calculation  PT Start Time (ACUTE ONLY) 1653  PT Stop Time (ACUTE ONLY) 1719  PT Time Calculation (min) (ACUTE ONLY) 26 min  PT General Charges  $$ ACUTE PT VISIT 1 Visit  PT Treatments  $Gait Training 8-22 mins  $Therapeutic Exercise 8-22 mins

## 2018-07-19 NOTE — Progress Notes (Signed)
Foley catheter removed at approximately 0603 without difficulty.

## 2018-07-20 DIAGNOSIS — M1711 Unilateral primary osteoarthritis, right knee: Secondary | ICD-10-CM | POA: Diagnosis not present

## 2018-07-20 MED ORDER — OXYCODONE HCL 5 MG PO TABS: 5.0000 mg | ORAL_TABLET | ORAL | 0 refills | Status: AC | PRN

## 2018-07-20 MED ORDER — ASPIRIN EC 325 MG PO TBEC: 325.0000 mg | DELAYED_RELEASE_TABLET | Freq: Two times a day (BID) | ORAL | 11 refills | Status: AC

## 2018-07-20 MED ORDER — METHOCARBAMOL 500 MG PO TABS: 500.0000 mg | ORAL_TABLET | Freq: Four times a day (QID) | ORAL | 0 refills | Status: DC

## 2018-07-20 NOTE — Progress Notes (Addendum)
   Subjective: 2 Days Post-Op Procedure(s) (LRB): TOTAL KNEE ARTHROPLASTY (Right) Patient reports pain as moderate.   Patient seen in rounds for Dr. Theda Sers. Patient is well, and had no acute complaints other than pain in the right knee and mild nausea. She has not had any episodes of vomiting, but states she feels nauseous when she stands up. Voiding without difficulty, positive flatus. Denies abdominal pain. No acute events overnight. Plan is to go Home after hospital stay.  Objective: Vital signs in last 24 hours: Temp:  [97.7 F (36.5 C)-98.6 F (37 C)] 98.6 F (37 C) (06/28 0520) Pulse Rate:  [71-82] 80 (06/28 0520) Resp:  [16-20] 16 (06/28 0520) BP: (127-166)/(73-91) 159/84 (06/28 0520) SpO2:  [97 %-100 %] 97 % (06/28 0520)  Intake/Output from previous day:  Intake/Output Summary (Last 24 hours) at 07/20/2018 0812 Last data filed at 07/20/2018 0745 Gross per 24 hour  Intake 1294.69 ml  Output 1676 ml  Net -381.31 ml    Intake/Output this shift: Total I/O In: -  Out: 400 [Urine:400]  Labs: Recent Labs    07/18/18 1216  HGB 13.5   Recent Labs    07/18/18 1216  WBC 9.8  RBC 4.43  HCT 41.6  PLT 283   Recent Labs    07/18/18 1216  CREATININE 0.76   No results for input(s): LABPT, INR in the last 72 hours.  Exam: General - Patient is Alert and Oriented Extremity - Neurologically intact Sensation intact distally Intact pulses distally Dorsiflexion/Plantar flexion intact Dressing/Incision - clean, no drainage Motor Function - intact, moving foot and toes well on exam.   Past Medical History:  Diagnosis Date  . Arthritis    knees  . Depression    with death of spouse and son  . Dermatophytosis of nail [id number]  . Diverticulosis   . GERD (gastroesophageal reflux disease)   . Hyperlipidemia   . Hypertension   . Melanoma ([place]) 2005   face  . Pneumonia   . PONV (postoperative nausea and vomiting)   . Pulmonary nodules   . UTI (urinary tract  infection)    Completed Cipro Sept 25th from primary care MD    Assessment/Plan: 2 Days Post-Op Procedure(s) (LRB): TOTAL KNEE ARTHROPLASTY (Right) Active Problems:   S/P total knee replacement, right   Osteoarthritis of right knee  Estimated body mass index is 35.24 kg/m as calculated from the following:   Height as of this encounter: 5\' 7"  (1.702 m).   Weight as of this encounter: 102.1 kg. Advance diet Up with therapy D/C IV fluids  DVT Prophylaxis - Lovenox, transition to Aspirin at discharge. Weight-bearing as tolerated  Plan for discharge home today as long as she is meeting goals with therapy. Continue working with therapy for 1-2 sessions. Aquacell dressing in place. Follow up in the office in 2 weeks.  Griffith Citron, PA-C Orthopedic Surgery 07/20/2018, 8:12 AM

## 2018-07-20 NOTE — Plan of Care (Signed)

## 2018-07-20 NOTE — Progress Notes (Signed)
   07/20/18 1400  PT Visit Information  Last PT Received On 07/20/18---exercise focused session,  Pt progressing well. Ready for d/c from PT standpoint  Assistance Needed +1  History of Present Illness s/p R TKA; PMH: L TKA  Subjective Data  Patient Stated Goal home  Precautions  Precautions Fall;Knee  Restrictions  Other Position/Activity Restrictions WBAT  Pain Assessment  Pain Assessment 0-10  Pain Score 4  Pain Location R knee  Pain Descriptors / Indicators Aching;Sore  Pain Intervention(s) Limited activity within patient's tolerance;Monitored during session;Ice applied  Cognition  Arousal/Alertness Awake/alert  Behavior During Therapy WFL for tasks assessed/performed  Overall Cognitive Status Within Functional Limits for tasks assessed  Bed Mobility  Bed Mobility Sit to Supine;Supine to Sit  Supine to sit Modified independent (Device/Increase time)  Transfers  Equipment used Rolling walker (2 wheeled)  Transfers Sit to/from Stand  Sit to Stand Supervision  General transfer comment for safety  Total Joint Exercises  Ankle Circles/Pumps AROM;Both;10 reps  Quad Sets AROM;Both;10 reps  Short Arc [employer];Right;10 reps  Heel Slides AAROM;Right;10 reps  Hip ABduction/ADduction AAROM;Right;10 reps;AROM  Straight Leg Raises AAROM;Right;10 reps  Goniometric ROM ~10* to 65* right knee flexion  Knee Flexion AAROM;Right;10 reps;Seated  Long Arc Quad AROM;Right;10 reps;Seated  PT - End of Session  Equipment Utilized During Treatment Gait belt  Activity Tolerance Patient tolerated treatment well  Patient left in bed;with call bell/phone within reach  Nurse Communication Mobility status (ready for d/c)   PT - Assessment/Plan  PT Plan Current plan remains appropriate  PT Visit Diagnosis Difficulty in walking, not elsewhere classified (R26.2)  PT Frequency (ACUTE ONLY) 7X/week  Follow Up Recommendations Follow surgeon's recommendation for DC plan and follow-up therapies  PT  equipment None recommended by PT  AM-PAC PT "6 Clicks" Mobility Outcome Measure (Version 2)  Help needed turning from your back to your side while in a flat bed without using bedrails? 3  Help needed moving from lying on your back to sitting on the side of a flat bed without using bedrails? 3  Help needed moving to and from a bed to a chair (including a wheelchair)? 3  Help needed standing up from a chair using your arms (e.g., wheelchair or bedside chair)? 3  Help needed to walk in hospital room? 3  Help needed climbing 3-5 steps with a railing?  3  6 Click Score 18  Consider Recommendation of Discharge To: Home with [facility]  PT Goal Progression  Progress towards PT goals Progressing toward goals  Acute Rehab PT Goals  PT Goal Formulation With patient  Time For Goal Achievement 07/24/18  Potential to Achieve Goals Good  PT Time Calculation  PT Start Time (ACUTE ONLY) 1415  PT Stop Time (ACUTE ONLY) 1434  PT Time Calculation (min) (ACUTE ONLY) 19 min  PT General Charges  $$ ACUTE PT VISIT 1 Visit  PT Treatments  $Therapeutic Exercise 8-22 mins

## 2018-07-20 NOTE — Progress Notes (Addendum)
Physical Therapy Treatment Patient Details Name: Casey Larsen MRN: [id number] DOB: 1955/05/06 Today's Date: 07/20/2018    History of Present Illness s/p R TKA; PMH: L TKA    PT Comments    Pt progressing well, increased independence with bed mobility, gait and transfers. Will see for second session to review HEP/knee ROM  Follow Up Recommendations  Follow surgeon's recommendation for DC plan and follow-up therapies     Equipment Recommendations  None recommended by PT    Recommendations for Other Services       Precautions / Restrictions Precautions Precautions: Fall;Knee Restrictions Weight Bearing Restrictions: No Other Position/Activity Restrictions: WBAT    Mobility  Bed Mobility   Bed Mobility: Sit to Supine;Supine to Sit     Supine to sit: Supervision Sit to supine: Supervision   General bed mobility comments: cues for technique without assist   Transfers Overall transfer level: Needs assistance Equipment used: Rolling walker (2 wheeled) Transfers: Sit to/from Stand Sit to Stand: Supervision         General transfer comment: cues for hand placement   Ambulation/Gait Ambulation/Gait assistance: Supervision Gait Distance (Feet): 80 Feet Assistive device: Rolling walker (2 wheeled) Gait Pattern/deviations: Step-to pattern;Decreased weight shift to right     General Gait Details: cues for gait progression,  improved wt shift to RLE and fluidity of gait   Stairs      instructed in up/down 1 step with RW, pt able to return demo with min assist       Wheelchair Mobility    Modified Rankin (Stroke Patients Only)       Balance                                            Cognition Arousal/Alertness: Awake/alert Behavior During Therapy: WFL for tasks assessed/performed Overall Cognitive Status: Within Functional Limits for tasks assessed                                        Exercises       General Comments        Pertinent Vitals/Pain Pain Assessment: 0-10 Pain Score: 4  Pain Location: R knee Pain Descriptors / Indicators: Aching;Sore Pain Intervention(s): Limited activity within patient's tolerance;Monitored during session;Ice applied;Premedicated before session    Home Living                      Prior Function            PT Goals (current goals can now be found in the care plan section) Acute Rehab PT Goals Patient Stated Goal: home PT Goal Formulation: With patient Time For Goal Achievement: 07/24/18 Potential to Achieve Goals: Good Progress towards PT goals: Progressing toward goals    Frequency    7X/week      PT Plan Current plan remains appropriate    Co-evaluation              AM-PAC PT "6 Clicks" Mobility   Outcome Measure  Help needed turning from your back to your side while in a flat bed without using bedrails?: A Little Help needed moving from lying on your back to sitting on the side of a flat bed without using bedrails?: A Little Help needed moving to and from  a bed to a chair (including a wheelchair)?: A Little Help needed standing up from a chair using your arms (e.g., wheelchair or bedside chair)?: A Little Help needed to walk in hospital room?: A Little Help needed climbing 3-5 steps with a railing? : A Little 6 Click Score: 18    End of Session Equipment Utilized During Treatment: Gait belt Activity Tolerance: Patient tolerated treatment well Patient left: in bed;with call bell/phone within reach   PT Visit Diagnosis: Difficulty in walking, not elsewhere classified (R26.2)     Time: 1020-1036 PT Time Calculation (min) (ACUTE ONLY): 16 min  Charges:  $Gait Training: 8-22 mins                     Kenyon Ana, PT  Pager: [phone] Acute Rehab Dept [facility]): [phone]   07/20/2018    [facility] 07/20/2018, 10:44 AM

## 2018-07-21 ENCOUNTER — Encounter (HOSPITAL_COMMUNITY): Payer: Self-pay | Admitting: Specialist

## 2018-10-07 ENCOUNTER — Other Ambulatory Visit: Payer: Self-pay | Admitting: Physician Assistant

## 2018-12-09 ENCOUNTER — Other Ambulatory Visit: Payer: Self-pay | Admitting: Internal Medicine

## 2018-12-09 DIAGNOSIS — Z1231 Encounter for screening mammogram for malignant neoplasm of breast: Secondary | ICD-10-CM

## 2019-02-04 ENCOUNTER — Other Ambulatory Visit: Payer: Self-pay

## 2019-02-04 ENCOUNTER — Ambulatory Visit
Admission: RE | Admit: 2019-02-04 | Discharge: 2019-02-04 | Disposition: A | Discharge status: Home / Self Care | Payer: Commercial Managed Care - PPO | Source: Ambulatory Visit | Attending: Internal Medicine | Admitting: Internal Medicine

## 2019-02-04 DIAGNOSIS — Z1231 Encounter for screening mammogram for malignant neoplasm of breast: Secondary | ICD-10-CM

## 2019-04-20 ENCOUNTER — Other Ambulatory Visit: Payer: Self-pay

## 2019-04-20 ENCOUNTER — Ambulatory Visit (INDEPENDENT_AMBULATORY_CARE_PROVIDER_SITE_OTHER): Payer: Commercial Managed Care - PPO

## 2019-04-20 ENCOUNTER — Encounter: Payer: Self-pay | Admitting: Podiatry

## 2019-04-20 ENCOUNTER — Ambulatory Visit (INDEPENDENT_AMBULATORY_CARE_PROVIDER_SITE_OTHER): Payer: Commercial Managed Care - PPO | Admitting: Podiatry

## 2019-04-20 DIAGNOSIS — M722 Plantar fascial fibromatosis: Secondary | ICD-10-CM

## 2019-04-22 NOTE — Progress Notes (Signed)
Subjective:   Patient ID: Casey Larsen, female   DOB: 64 y.o.   MRN: [id number]   HPI Patient presents stating she is developed acute pain in the plantar aspect of the right heel and does not remember specific injury and its been going on now for several months.   ROS      Objective:  Physical Exam  Neurovascular status intact with acute discomfort plantar fascial right at the insertional point tendon into the calcaneus with moderate depression of the arch     Assessment:  Acute plantar fasciitis right with inflammation fluid buildup     Plan:  H&P x-ray reviewed reviewed condition sterile prep done and injected the plantar fascia 3 mg Kenalog 5 mg Xylocaine applied fascial brace to lift up the arch and gave instructions for supportive shoe gear usage and anti-inflammatories over-the-counter.  Patient will be seen back to recheck 3 weeks  X-rays indicate that the heel does have small spur no indications of stress fracture or advanced arthritis

## 2019-05-13 ENCOUNTER — Ambulatory Visit: Payer: Commercial Managed Care - PPO | Admitting: Podiatry

## 2020-01-04 ENCOUNTER — Other Ambulatory Visit: Payer: Self-pay | Admitting: Internal Medicine

## 2020-01-04 DIAGNOSIS — Z1231 Encounter for screening mammogram for malignant neoplasm of breast: Secondary | ICD-10-CM

## 2020-01-14 ENCOUNTER — Telehealth: Payer: Self-pay | Admitting: Nurse Practitioner

## 2020-01-14 NOTE — Telephone Encounter (Signed)
Called to Discuss with patient about Covid symptoms and the use of the monoclonal antibody infusion for those with mild to moderate Covid symptoms and at a high risk of hospitalization.     Pt appears to qualify for this infusion due to co-morbid conditions and/or a member of an at-risk group in accordance with the FDA Emergency Use Authorization. (htn, bmi>30).    Unable to reach pt. Voicemail left   Alda Lea, NP [facility]  [phone]

## 2020-02-15 ENCOUNTER — Ambulatory Visit: Payer: Commercial Managed Care - PPO

## 2020-02-16 ENCOUNTER — Ambulatory Visit
Admission: RE | Admit: 2020-02-16 | Discharge: 2020-02-16 | Disposition: A | Discharge status: Home / Self Care | Payer: Commercial Managed Care - PPO | Source: Ambulatory Visit | Attending: Internal Medicine | Admitting: Internal Medicine

## 2020-02-16 ENCOUNTER — Other Ambulatory Visit: Payer: Self-pay

## 2020-02-16 DIAGNOSIS — Z1231 Encounter for screening mammogram for malignant neoplasm of breast: Secondary | ICD-10-CM

## 2020-04-05 ENCOUNTER — Other Ambulatory Visit: Payer: Self-pay

## 2020-04-05 ENCOUNTER — Ambulatory Visit (INDEPENDENT_AMBULATORY_CARE_PROVIDER_SITE_OTHER): Payer: Commercial Managed Care - PPO | Admitting: Physician Assistant

## 2020-04-05 ENCOUNTER — Encounter: Payer: Self-pay | Admitting: Physician Assistant

## 2020-04-05 DIAGNOSIS — Z1283 Encounter for screening for malignant neoplasm of skin: Secondary | ICD-10-CM | POA: Diagnosis not present

## 2020-04-05 DIAGNOSIS — Z86018 Personal history of other benign neoplasm: Secondary | ICD-10-CM | POA: Diagnosis not present

## 2020-04-05 DIAGNOSIS — Z85828 Personal history of other malignant neoplasm of skin: Secondary | ICD-10-CM

## 2020-04-05 DIAGNOSIS — D485 Neoplasm of uncertain behavior of skin: Secondary | ICD-10-CM | POA: Diagnosis not present

## 2020-04-05 DIAGNOSIS — L820 Inflamed seborrheic keratosis: Secondary | ICD-10-CM

## 2020-04-05 DIAGNOSIS — L570 Actinic keratosis: Secondary | ICD-10-CM | POA: Diagnosis not present

## 2020-04-05 DIAGNOSIS — D0472 Carcinoma in situ of skin of left lower limb, including hip: Secondary | ICD-10-CM | POA: Diagnosis not present

## 2020-04-05 NOTE — Patient Instructions (Signed)

## 2020-04-11 ENCOUNTER — Telehealth: Payer: Self-pay

## 2020-04-11 ENCOUNTER — Encounter: Payer: Self-pay | Admitting: Physician Assistant

## 2020-04-11 NOTE — Progress Notes (Deleted)
Follow-Up Visit   Subjective  Casey Larsen is a 65 y.o. female who presents for the following: Annual Exam (Patient here today for yearly skin check. Patient would like a spot on her left shoulder rechecked per patient her bra strap is now irritating it.  Patient also has a spot on her right pinky finger x 1 month per patient it's painful, no bleeding.).   The following portions of the chart were reviewed this encounter and updated as appropriate:  Tobacco  Allergies  Meds  Problems  Med Hx  Surg Hx  Fam Hx      Objective  Well appearing patient in no apparent distress; mood and affect are within normal limits.  A full examination was performed including scalp, head, eyes, ears, nose, lips, neck, chest, axillae, abdomen, back, buttocks, bilateral upper extremities, bilateral lower extremities, hands, feet, fingers, toes, fingernails, and toenails. All findings within normal limits unless otherwise noted below.  Objective  Left Inguinal Area: Full body skin examination  Objective  Left Outer Thigh: White scar- clear  Objective  Left Hip: Multiple white scar -clear  Objective  Left Lower Leg - Anterior: Pink pearly papule     Objective  Left Shoulder - Anterior: Dark raised      Right Distal 5th Finger     Left Thigh - Posterior       Objective  Right Forehead: Erythematous patches with gritty scale.   Assessment & Plan  Encounter for screening for malignant neoplasm of skin Left Inguinal Area  Yearly skin check  History of squamous cell carcinoma of skin Left Outer Thigh  Yearly skin check  History of dysplastic nevus Left Hip  Yearly skin check  Neoplasm of uncertain behavior of skin (4) Left Lower Leg - Anterior  Skin / nail biopsy Type of biopsy: tangential   Informed consent: discussed and consent obtained   Timeout: patient name, date of birth, surgical site, and procedure verified   Procedure prep:  Patient was  prepped and draped in usual sterile fashion (Non sterile) Prep type:  Chlorhexidine Anesthesia: the lesion was anesthetized in a standard fashion   Anesthetic:  1% lidocaine w/ epinephrine 1-100,000 local infiltration Instrument used: flexible razor blade   Outcome: patient tolerated procedure well   Post-procedure details: wound care instructions given    Specimen 1 - Surgical pathology Differential Diagnosis: bcc vs scc  Check Margins: No  Left Shoulder - Anterior  Skin / nail biopsy Type of biopsy: tangential   Informed consent: discussed and consent obtained   Timeout: patient name, date of birth, surgical site, and procedure verified   Procedure prep:  Patient was prepped and draped in usual sterile fashion (Non sterile) Prep type:  Chlorhexidine Anesthesia: the lesion was anesthetized in a standard fashion   Anesthetic:  1% lidocaine w/ epinephrine 1-100,000 local infiltration Instrument used: flexible razor blade   Outcome: patient tolerated procedure well   Post-procedure details: wound care instructions given    Specimen 2 - Surgical pathology Differential Diagnosis: r/o sk   Check Margins: No  Right Distal 5th Finger  Skin / nail biopsy Type of biopsy: tangential   Informed consent: discussed and consent obtained   Timeout: patient name, date of birth, surgical site, and procedure verified   Procedure prep:  Patient was prepped and draped in usual sterile fashion (Non sterile) Prep type:  Chlorhexidine Anesthesia: the lesion was anesthetized in a standard fashion   Anesthetic:  1% lidocaine w/ epinephrine 1-100,000 local  infiltration Instrument used: flexible razor blade   Outcome: patient tolerated procedure well   Post-procedure details: wound care instructions given    Specimen 3 - Surgical pathology Differential Diagnosis: r/o cyst  Check Margins: No  Left Thigh - Posterior  Skin / nail biopsy  Specimen 4 - Surgical pathology Differential  Diagnosis: bcc vs scc  Check Margins: No  AK (actinic keratosis) Right Forehead  Destruction of lesion - Right Forehead Complexity: simple   Destruction method: cryotherapy   Informed consent: discussed and consent obtained   Timeout:  patient name, date of birth, surgical site, and procedure verified Lesion destroyed using liquid nitrogen: Yes   Cryotherapy cycles:  3 Outcome: patient tolerated procedure well with no complications      I, SHEFFIELD,KELLI, PA-C, have reviewed all documentation's for this visit.  The documentation on 04/11/20 for the exam, diagnosis, procedures and orders are all accurate and complete.

## 2020-04-11 NOTE — Telephone Encounter (Signed)
-----   Message from Warren Danes, [place] sent at 04/11/2020 11:43 AM EDT ----- 30

## 2020-04-11 NOTE — Telephone Encounter (Signed)
Path to patient June 29 surgery made

## 2020-04-19 NOTE — Progress Notes (Addendum)
Follow-Up Visit   Subjective  Casey Larsen is a 65 y.o. female who presents for the following: Annual Exam (Patient here today for yearly skin check. Patient would like a spot on her left shoulder rechecked per patient her bra strap is now irritating it.  Patient also has a spot on her right pinky finger x 1 month per patient it's painful, no bleeding.).   The following portions of the chart were reviewed this encounter and updated as appropriate:  Tobacco  Allergies  Meds  Problems  Med Hx  Surg Hx  Fam Hx      Objective  Well appearing patient in no apparent distress; mood and affect are within normal limits.  A full examination was performed including scalp, head, eyes, ears, nose, lips, neck, chest, axillae, abdomen, back, buttocks, bilateral upper extremities, bilateral lower extremities, hands, feet, fingers, toes, fingernails, and toenails. All findings within normal limits unless otherwise noted below.  Objective  Left Inguinal Area: No signs of non-mole skin cancer.   Objective  Left Outer Thigh: White scar- clear  Objective  Left Hip: Multiple white scar -clear  Objective  Left Lower Leg - Anterior: Pink pearly papule     Objective  Left Shoulder - Anterior: Dark raised lesion     Objective  Right Distal 5th Finger: Hyperkeratotic scale with pink base      Objective  Left Thigh - Posterior: Bichromic dark nested macule.        Objective  Right Forehead: Erythematous patches with gritty scale.  Assessment & Plan  Encounter for screening for malignant neoplasm of skin Left Inguinal Area  Yearly skin check  History of squamous cell carcinoma of skin Left Outer Thigh  Yearly skin check  History of dysplastic nevus Left Hip  Yearly skin check  Neoplasm of uncertain behavior of skin (4) Left Lower Leg - Anterior  Skin / nail biopsy Type of biopsy: tangential   Informed consent: discussed and consent obtained    Timeout: patient name, date of birth, surgical site, and procedure verified   Procedure prep:  Patient was prepped and draped in usual sterile fashion (Non sterile) Prep type:  Chlorhexidine Anesthesia: the lesion was anesthetized in a standard fashion   Anesthetic:  1% lidocaine w/ epinephrine 1-100,000 local infiltration Instrument used: flexible razor blade   Outcome: patient tolerated procedure well   Post-procedure details: wound care instructions given    Specimen 1 - Surgical pathology Differential Diagnosis: bcc vs scc  Check Margins: No  Left Shoulder - Anterior  Skin / nail biopsy Type of biopsy: tangential   Informed consent: discussed and consent obtained   Timeout: patient name, date of birth, surgical site, and procedure verified   Procedure prep:  Patient was prepped and draped in usual sterile fashion (Non sterile) Prep type:  Chlorhexidine Anesthesia: the lesion was anesthetized in a standard fashion   Anesthetic:  1% lidocaine w/ epinephrine 1-100,000 local infiltration Instrument used: flexible razor blade   Outcome: patient tolerated procedure well   Post-procedure details: wound care instructions given    Specimen 2 - Surgical pathology Differential Diagnosis: r/o sk   Check Margins: No  Right Distal 5th Finger  Skin / nail biopsy Type of biopsy: tangential   Informed consent: discussed and consent obtained   Timeout: patient name, date of birth, surgical site, and procedure verified   Procedure prep:  Patient was prepped and draped in usual sterile fashion (Non sterile) Prep type:  Chlorhexidine Anesthesia: the  lesion was anesthetized in a standard fashion   Anesthetic:  1% lidocaine w/ epinephrine 1-100,000 local infiltration Instrument used: flexible razor blade   Outcome: patient tolerated procedure well   Post-procedure details: wound care instructions given    Specimen 3 - Surgical pathology Differential Diagnosis: r/o cyst  Check  Margins: No  Left Thigh - Posterior  Skin / nail biopsy Type of biopsy: tangential   Informed consent: discussed and consent obtained   Timeout: patient name, date of birth, surgical site, and procedure verified   Anesthesia: the lesion was anesthetized in a standard fashion   Anesthetic:  1% lidocaine w/ epinephrine 1-100,000 local infiltration Instrument used: flexible razor blade   Hemostasis achieved with: aluminum chloride and electrodesiccation   Outcome: patient tolerated procedure well   Post-procedure details: wound care instructions given    Specimen 4 - Surgical pathology Differential Diagnosis: bcc vs scc  Check Margins: No  AK (actinic keratosis) Right Forehead  Destruction of lesion - Right Forehead Complexity: simple   Destruction method: cryotherapy   Informed consent: discussed and consent obtained   Timeout:  patient name, date of birth, surgical site, and procedure verified Lesion destroyed using liquid nitrogen: Yes   Cryotherapy cycles:  3 Outcome: patient tolerated procedure well with no complications     I, Tommy Minichiello, PA-C, have reviewed all documentation's for this visit.  The documentation on 06/06/20 for the exam, diagnosis, procedures and orders are all accurate and complete.

## 2020-06-06 NOTE — Addendum Note (Signed)
Addended by: Robyne Askew R on: 06/06/2020 10:33 AM   Modules accepted: Level of Service

## 2020-07-18 ENCOUNTER — Encounter: Payer: Commercial Managed Care - PPO | Admitting: Physician Assistant

## 2020-07-21 ENCOUNTER — Encounter: Payer: Commercial Managed Care - PPO | Admitting: Physician Assistant

## 2020-10-11 ENCOUNTER — Encounter: Payer: Self-pay | Admitting: Physician Assistant

## 2020-10-11 ENCOUNTER — Other Ambulatory Visit: Payer: Self-pay

## 2020-10-11 ENCOUNTER — Ambulatory Visit (INDEPENDENT_AMBULATORY_CARE_PROVIDER_SITE_OTHER): Payer: Commercial Managed Care - PPO | Admitting: Physician Assistant

## 2020-10-11 DIAGNOSIS — D0472 Carcinoma in situ of skin of left lower limb, including hip: Secondary | ICD-10-CM | POA: Diagnosis not present

## 2020-10-11 MED ORDER — MUPIROCIN 2 % EX OINT: 1.0000 "application " | TOPICAL_OINTMENT | Freq: Two times a day (BID) | CUTANEOUS | 0 refills | Status: DC

## 2020-10-11 NOTE — Progress Notes (Signed)
   Follow-Up Visit   Subjective  Casey Larsen is a 65 y.o. female who presents for the following: Procedure (Patient here today for treatment of CIS x 1 left lower leg anterior).   The following portions of the chart were reviewed this encounter and updated as appropriate:  Tobacco  Allergies  Meds  Problems  Med Hx  Surg Hx  Fam Hx      Objective  Well appearing patient in no apparent distress; mood and affect are within normal limits.  A focused examination was performed including left lower leg. Relevant physical exam findings are noted in the Assessment and Plan.  Left Lower Leg - Anterior Pink macule.   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, left Left Lower Leg - Anterior  Destruction of lesion Complexity: simple   Destruction method: electrodesiccation and curettage   Informed consent: discussed and consent obtained   Timeout:  patient name, date of birth, surgical site, and procedure verified Anesthesia: the lesion was anesthetized in a standard fashion   Anesthetic:  1% lidocaine w/ epinephrine 1-100,000 local infiltration Curettage performed in three different directions: Yes   Curettage cycles:  3 Final wound size (cm):  2.5 Hemostasis achieved with:  ferric subsulfate Outcome: patient tolerated procedure well with no complications   Post-procedure details: wound care instructions given   Additional details:  Wound innoculated with 5 fluorouracil solution.  mupirocin ointment (BACTROBAN) 2 % Apply 1 application topically 2 (two) times daily.    I, Mora Pedraza, PA-C, have reviewed all documentation's for this visit.  The documentation on 10/11/20 for the exam, diagnosis, procedures and orders are all accurate and complete.

## 2020-10-11 NOTE — Patient Instructions (Signed)

## 2021-01-10 ENCOUNTER — Other Ambulatory Visit: Payer: Self-pay | Admitting: Internal Medicine

## 2021-01-10 DIAGNOSIS — Z1231 Encounter for screening mammogram for malignant neoplasm of breast: Secondary | ICD-10-CM

## 2021-02-16 ENCOUNTER — Ambulatory Visit
Admission: RE | Admit: 2021-02-16 | Discharge: 2021-02-16 | Disposition: A | Discharge status: Home / Self Care | Payer: Commercial Managed Care - PPO | Source: Ambulatory Visit | Attending: Internal Medicine | Admitting: Internal Medicine

## 2021-02-16 DIAGNOSIS — Z1231 Encounter for screening mammogram for malignant neoplasm of breast: Secondary | ICD-10-CM

## 2021-04-12 ENCOUNTER — Ambulatory Visit: Payer: Commercial Managed Care - PPO | Admitting: Physician Assistant

## 2021-05-25 ENCOUNTER — Ambulatory Visit (INDEPENDENT_AMBULATORY_CARE_PROVIDER_SITE_OTHER): Payer: Commercial Managed Care - PPO | Admitting: Physician Assistant

## 2021-05-25 DIAGNOSIS — Z86007 Personal history of in-situ neoplasm of skin: Secondary | ICD-10-CM | POA: Diagnosis not present

## 2021-05-31 ENCOUNTER — Encounter: Payer: Self-pay | Admitting: Physician Assistant

## 2021-05-31 NOTE — Progress Notes (Signed)
? ?  Follow-Up Visit ?  ?Subjective  ?Casey Larsen is a 66 y.o. female who presents for the following: Follow-up (Follow up on CIS on left lower leg. Patient is doing well no new concerns. ). ? ? ?The following portions of the chart were reviewed this encounter and updated as appropriate:  Tobacco  Allergies  Meds  Problems  Med Hx  Surg Hx  Fam Hx   ?  ? ?Objective  ?Well appearing patient in no apparent distress; mood and affect are within normal limits. ? ?A focused examination was performed including lower extremities. Relevant physical exam findings are noted in the Assessment and Plan. ? ?Left Lower Leg - Anterior ?Scar is clear ? ? ?Assessment & Plan  ?History of squamous cell carcinoma in situ (SCCIS) ?Left Lower Leg - Anterior ? ?Follow up with yearly skin exams.  ? ? ? ?I, Tyion Boylen, PA-C, have reviewed all documentation's for this visit.  The documentation on 05/31/21 for the exam, diagnosis, procedures and orders are all accurate and complete. ?

## 2021-06-09 ENCOUNTER — Encounter: Payer: Self-pay | Admitting: Internal Medicine

## 2021-07-18 ENCOUNTER — Encounter: Payer: Self-pay | Admitting: Internal Medicine

## 2021-07-27 ENCOUNTER — Ambulatory Visit (AMBULATORY_SURGERY_CENTER): Payer: Commercial Managed Care - PPO | Admitting: *Deleted

## 2021-07-27 VITALS — Ht 67.0 in | Wt 236.0 lb

## 2021-07-27 DIAGNOSIS — Z1211 Encounter for screening for malignant neoplasm of colon: Secondary | ICD-10-CM

## 2021-07-27 MED ORDER — NA SULFATE-K SULFATE-MG SULF 17.5-3.13-1.6 GM/177ML PO SOLN: 1.0000 | ORAL | 0 refills | Status: DC

## 2021-07-27 NOTE — Progress Notes (Signed)
Patient's pre-visit was done today over the phone with the patient. Name,DOB and address verified. Patient denies any allergies to Eggs and Soy. Patient denies any problems with anesthesia/sedation. Patient is not taking any diet pills or blood thinners. No home Oxygen. Insurance confirmed with patient.  Went over prep instructions with patient. Prep instructions sent to pt's MyChart or mailed to pt-pt is aware. Patient understands to call us back with any questions or concerns. Patient is aware of our care-partner policy. Pt will use good [facility] for suprep rx.  EMMI education assigned to the patient for the procedure, sent to [place].

## 2021-08-11 ENCOUNTER — Encounter: Payer: Self-pay | Admitting: Internal Medicine

## 2021-08-17 ENCOUNTER — Ambulatory Visit (AMBULATORY_SURGERY_CENTER): Payer: Commercial Managed Care - PPO | Admitting: Internal Medicine

## 2021-08-17 ENCOUNTER — Encounter: Payer: Self-pay | Admitting: Internal Medicine

## 2021-08-17 VITALS — BP 135/82 | HR 72 | Temp 98.9°F | Resp 16 | Ht 67.0 in | Wt 236.0 lb

## 2021-08-17 DIAGNOSIS — Z1211 Encounter for screening for malignant neoplasm of colon: Secondary | ICD-10-CM | POA: Diagnosis present

## 2021-08-17 MED ORDER — SODIUM CHLORIDE 0.9 % IV SOLN: 500.0000 mL | INTRAVENOUS | Status: DC

## 2021-08-17 NOTE — Op Note (Signed)
[facility] Patient Name: Casey Larsen Procedure Date: 08/17/2021 2:40 PM MRN: [id number] Endoscopist: Docia Chuck. Henrene Pastor , MD Age: 66 Referring MD:  Date of Birth: Jan 16, 1956 Gender: Female Account #: [id number] Procedure:                Colonoscopy Indications:              Screening for colorectal malignant neoplasm.                            Previous examinations 2008 and 2013 were negative                            for neoplasia Medicines:                Monitored Anesthesia Care Procedure:                Pre-Anesthesia Assessment:                           - Prior to the procedure, a History and Physical                            was performed, and patient medications and                            allergies were reviewed. The patient's tolerance of                            previous anesthesia was also reviewed. The risks                            and benefits of the procedure and the sedation                            options and risks were discussed with the patient.                            All questions were answered, and informed consent                            was obtained. Prior Anticoagulants: The patient has                            taken no previous anticoagulant or antiplatelet                            agents. ASA Grade Assessment: II - A patient with                            mild systemic disease. After reviewing the risks                            and benefits, the patient was deemed in  satisfactory condition to undergo the procedure.                           After obtaining informed consent, the colonoscope                            was passed under direct vision. Throughout the                            procedure, the patient's blood pressure, pulse, and                            oxygen saturations were monitored continuously. The                            PCF-HQ190L Colonoscope was introduced through the                             anus and advanced to the the cecum, identified by                            appendiceal orifice and ileocecal valve. The                            ileocecal valve, appendiceal orifice, and rectum                            were photographed. The quality of the bowel                            preparation was excellent. The colonoscopy was                            performed without difficulty. The patient tolerated                            the procedure well. The bowel preparation used was                            SUPREP via split dose instruction. Scope In: 2:54:01 PM Scope Out: 3:08:39 PM Scope Withdrawal Time: 0 hours 6 minutes 14 seconds  Total Procedure Duration: 0 hours 14 minutes 38 seconds  Findings:                 Multiple small and large-mouthed diverticula were                            found in the entire colon.                           Internal hemorrhoids were found during                            retroflexion. The hemorrhoids were moderate.  The exam was otherwise without abnormality on                            direct and retroflexion views. Complications:            No immediate complications. Estimated blood loss:                            None. Estimated Blood Loss:     Estimated blood loss: none. Impression:               - Diverticulosis in the entire examined colon.                            Internal hemorrhoids.                           - The examination was otherwise normal on direct                            and retroflexion views.                           - No specimens collected. Recommendation:           - Repeat colonoscopy in 10 years for screening                            purposes.                           - Patient has a contact number available for                            emergencies. The signs and symptoms of potential                            delayed complications were  discussed with the                            patient. Return to normal activities tomorrow.                            Written discharge instructions were provided to the                            patient.                           - Resume previous diet.                           - Continue present medications. Docia Chuck. Henrene Pastor, MD 08/17/2021 3:16:36 PM This report has been signed electronically.

## 2021-08-17 NOTE — Patient Instructions (Signed)
YOU HAD AN ENDOSCOPIC PROCEDURE TODAY AT [facility]:   Refer to the procedure report that was given to you for any specific questions about what was found during the examination.  If the procedure report does not answer your questions, please call your gastroenterologist to clarify.  If you requested that your care partner not be given the details of your procedure findings, then the procedure report has been included in a sealed envelope for you to review at your convenience later.  YOU SHOULD EXPECT: Some feelings of bloating in the abdomen. Passage of more gas than usual.  Walking can help get rid of the air that was put into your GI tract during the procedure and reduce the bloating. If you had a lower endoscopy (such as a colonoscopy or flexible sigmoidoscopy) you may notice spotting of blood in your stool or on the toilet paper. If you underwent a bowel prep for your procedure, you may not have a normal bowel movement for a few days.  Please Note:  You might notice some irritation and congestion in your nose or some drainage.  This is from the oxygen used during your procedure.  There is no need for concern and it should clear up in a day or so.  SYMPTOMS TO REPORT IMMEDIATELY:  Following lower endoscopy (colonoscopy or flexible sigmoidoscopy):  Excessive amounts of blood in the stool  Significant tenderness or worsening of abdominal pains  Swelling of the abdomen that is new, acute  Fever of 100F or higher    For urgent or emergent issues, a gastroenterologist can be reached at any hour by calling [phone]. Do not use MyChart messaging for urgent concerns.    DIET:  We do recommend a small meal at first, but then you may proceed to your regular diet.  Drink plenty of fluids but you should avoid alcoholic beverages for 24 hours.  MEDICATIONS: Continue present medications.  Please see handouts given to you by your recovery nurse.  Thank you for allowing [place] to  provide for your healthcare needs today.  ACTIVITY:  You should plan to take it easy for the rest of today and you should NOT DRIVE or use heavy machinery until tomorrow (because of the sedation medicines used during the test).    FOLLOW UP: Our staff will call the number listed on your records the next business day following your procedure.  We will call around 7:15- 8:00 am to check on you and address any questions or concerns that you may have regarding the information given to you following your procedure. If we do not reach you, we will leave a message.  If you develop any symptoms (ie: fever, flu-like symptoms, shortness of breath, cough etc.) before then, please call [phone].  If you test positive for Covid 19 in the 2 weeks post procedure, please call and report this information to [place].    If any biopsies were taken you will be contacted by phone or by letter within the next 1-3 weeks.  Please call us at [phone] if you have not heard about the biopsies in 3 weeks.    SIGNATURES/CONFIDENTIALITY: You and/or your care partner have signed paperwork which will be entered into your electronic medical record.  These signatures attest to the fact that that the information above on your After Visit Summary has been reviewed and is understood.  Full responsibility of the confidentiality of this discharge information lies with you and/or your care-partner.

## 2021-08-17 NOTE — Progress Notes (Signed)
HISTORY OF PRESENT ILLNESS:  Casey Larsen is a 66 y.o. female who presents today for screening colonoscopy.  Previous examinations 2008 and 2013 were negative for neoplasia.  No active complaints  REVIEW OF SYSTEMS:  All non-GI ROS negative.  Past Medical History:  Diagnosis Date   Arthritis    knees   Asthmatic bronchitis, mild intermittent, with acute exacerbation    Depression    with death of spouse and son   Dermatophytosis of nail 03/12/2012   Diverticulosis    GERD (gastroesophageal reflux disease)    Hyperlipidemia    Hypertension    Melanoma ([place]) 2005   face   Pneumonia    PONV (postoperative nausea and vomiting)    Pulmonary nodules    UTI (urinary tract infection)    Completed Cipro Sept 25th from primary care MD    Past Surgical History:  Procedure Laterality Date   ABDOMINAL HYSTERECTOMY  1998   bone spurs  2010, 2012   both feet   BREAST BIOPSY Right 2011   right, benign   BREAST EXCISIONAL BIOPSY Right 2012   COLONOSCOPY  05/22/2011   Dr.Axton Cihlar   JOINT REPLACEMENT  2012   left foot   KNEE ARTHROSCOPY  2000,2003   right and left   TOTAL KNEE ARTHROPLASTY Left 10/27/2013   Procedure: LEFT TOTAL KNEE ARTHROPLASTY;  Surgeon: Mauri Pole, MD;  Location: [facility];  Service: Orthopedics;  Laterality: Left;   TOTAL KNEE ARTHROPLASTY Right 07/18/2018   Procedure: TOTAL KNEE ARTHROPLASTY;  Surgeon: Sydnee Cabal, MD;  Location: [facility];  Service: Orthopedics;  Laterality: Right;  adductor canal block    Social History Casey Larsen  reports that she quit smoking about 16 years ago. Her smoking use included cigarettes. She has never used smokeless tobacco. She reports current alcohol use. She reports that she does not use drugs.  family history includes Breast cancer in her mother; Colon cancer in her maternal grandmother; Diabetes Mellitus I in her son.  No Known Allergies     PHYSICAL EXAMINATION: Vital signs: BP (!) 185/90   Pulse 70   Temp  98.9 F (37.2 C) (Temporal)   Ht '5\' 7"'$  (1.702 m)   Wt 236 lb (107 kg)   SpO2 100%   BMI 36.96 kg/m  General: Well-developed, well-nourished, no acute distress HEENT: Sclerae are anicteric, conjunctiva pink. Oral mucosa intact Lungs: Clear Heart: Regular Abdomen: soft, nontender, nondistended, no obvious ascites, no peritoneal signs, normal bowel sounds. No organomegaly. Extremities: No edema Psychiatric: alert and oriented x3. Cooperative      ASSESSMENT:  Colon cancer screening   PLAN:   Screening colonoscopy

## 2021-08-17 NOTE — Progress Notes (Signed)
VSS, transported to PACU °

## 2021-08-17 NOTE — Progress Notes (Signed)
Pt's states no medical or surgical changes since previsit or office visit. 

## 2021-08-18 ENCOUNTER — Telehealth: Payer: Self-pay | Admitting: *Deleted

## 2021-08-18 NOTE — Telephone Encounter (Signed)
  Follow up Call-     08/17/2021    1:44 PM  Call back number  Post procedure Call Back phone  # [phone]  Permission to leave phone message Yes     Patient questions:  Do you have a fever, pain , or abdominal swelling? No. Pain Score  0 *  Have you tolerated food without any problems? Yes.    Have you been able to return to your normal activities? Yes.    Do you have any questions about your discharge instructions: Diet   No. Medications  No. Follow up visit  No.  Do you have questions or concerns about your Care? No.  Actions: * If pain score is 4 or above: No action needed, pain <4.

## 2021-11-29 ENCOUNTER — Ambulatory Visit: Payer: Commercial Managed Care - PPO | Admitting: Physician Assistant

## 2022-01-05 ENCOUNTER — Other Ambulatory Visit: Payer: Self-pay | Admitting: Internal Medicine

## 2022-01-05 DIAGNOSIS — Z1231 Encounter for screening mammogram for malignant neoplasm of breast: Secondary | ICD-10-CM

## 2022-02-28 ENCOUNTER — Ambulatory Visit
Admission: RE | Admit: 2022-02-28 | Discharge: 2022-02-28 | Disposition: A | Discharge status: Home / Self Care | Payer: Commercial Managed Care - PPO | Source: Ambulatory Visit | Attending: Internal Medicine | Admitting: Internal Medicine

## 2022-02-28 DIAGNOSIS — Z1231 Encounter for screening mammogram for malignant neoplasm of breast: Secondary | ICD-10-CM

## 2022-03-05 ENCOUNTER — Observation Stay (HOSPITAL_COMMUNITY)
Admission: EM | Admit: 2022-03-05 | Discharge: 2022-03-07 | Disposition: A | Discharge status: Home / Self Care | Payer: Commercial Managed Care - PPO | Attending: Emergency Medicine | Admitting: Emergency Medicine

## 2022-03-05 ENCOUNTER — Inpatient Hospital Stay (HOSPITAL_COMMUNITY): Payer: Commercial Managed Care - PPO

## 2022-03-05 ENCOUNTER — Other Ambulatory Visit: Payer: Self-pay

## 2022-03-05 ENCOUNTER — Emergency Department (HOSPITAL_COMMUNITY): Payer: Commercial Managed Care - PPO

## 2022-03-05 ENCOUNTER — Encounter (HOSPITAL_COMMUNITY): Payer: Self-pay | Admitting: Internal Medicine

## 2022-03-05 DIAGNOSIS — I10 Essential (primary) hypertension: Secondary | ICD-10-CM | POA: Diagnosis not present

## 2022-03-05 DIAGNOSIS — Z79899 Other long term (current) drug therapy: Secondary | ICD-10-CM | POA: Insufficient documentation

## 2022-03-05 DIAGNOSIS — Z6836 Body mass index (BMI) 36.0-36.9, adult: Secondary | ICD-10-CM | POA: Diagnosis not present

## 2022-03-05 DIAGNOSIS — F32A Depression, unspecified: Secondary | ICD-10-CM | POA: Diagnosis present

## 2022-03-05 DIAGNOSIS — K5790 Diverticulosis of intestine, part unspecified, without perforation or abscess without bleeding: Secondary | ICD-10-CM | POA: Diagnosis present

## 2022-03-05 DIAGNOSIS — Z803 Family history of malignant neoplasm of breast: Secondary | ICD-10-CM | POA: Diagnosis not present

## 2022-03-05 DIAGNOSIS — K8020 Calculus of gallbladder without cholecystitis without obstruction: Principal | ICD-10-CM | POA: Diagnosis present

## 2022-03-05 DIAGNOSIS — Z87891 Personal history of nicotine dependence: Secondary | ICD-10-CM | POA: Diagnosis not present

## 2022-03-05 DIAGNOSIS — E876 Hypokalemia: Secondary | ICD-10-CM | POA: Diagnosis present

## 2022-03-05 DIAGNOSIS — Z85820 Personal history of malignant melanoma of skin: Secondary | ICD-10-CM | POA: Diagnosis not present

## 2022-03-05 DIAGNOSIS — K760 Fatty (change of) liver, not elsewhere classified: Secondary | ICD-10-CM | POA: Diagnosis present

## 2022-03-05 DIAGNOSIS — E785 Hyperlipidemia, unspecified: Secondary | ICD-10-CM | POA: Diagnosis present

## 2022-03-05 DIAGNOSIS — K8012 Calculus of gallbladder with acute and chronic cholecystitis without obstruction: Secondary | ICD-10-CM | POA: Insufficient documentation

## 2022-03-05 DIAGNOSIS — Z800 Family history of malignant neoplasm of digestive organs: Secondary | ICD-10-CM | POA: Diagnosis not present

## 2022-03-05 DIAGNOSIS — K851 Biliary acute pancreatitis without necrosis or infection: Principal | ICD-10-CM

## 2022-03-05 DIAGNOSIS — Z90710 Acquired absence of both cervix and uterus: Secondary | ICD-10-CM | POA: Diagnosis not present

## 2022-03-05 DIAGNOSIS — E8809 Other disorders of plasma-protein metabolism, not elsewhere classified: Secondary | ICD-10-CM | POA: Diagnosis present

## 2022-03-05 DIAGNOSIS — J4521 Mild intermittent asthma with (acute) exacerbation: Secondary | ICD-10-CM | POA: Insufficient documentation

## 2022-03-05 DIAGNOSIS — R9431 Abnormal electrocardiogram [ECG] [EKG]: Secondary | ICD-10-CM

## 2022-03-05 DIAGNOSIS — R109 Unspecified abdominal pain: Secondary | ICD-10-CM | POA: Diagnosis present

## 2022-03-05 DIAGNOSIS — Z833 Family history of diabetes mellitus: Secondary | ICD-10-CM

## 2022-03-05 DIAGNOSIS — Z96653 Presence of artificial knee joint, bilateral: Secondary | ICD-10-CM | POA: Insufficient documentation

## 2022-03-05 DIAGNOSIS — K839 Disease of biliary tract, unspecified: Secondary | ICD-10-CM

## 2022-03-05 DIAGNOSIS — E669 Obesity, unspecified: Secondary | ICD-10-CM | POA: Diagnosis present

## 2022-03-05 DIAGNOSIS — D649 Anemia, unspecified: Secondary | ICD-10-CM | POA: Diagnosis present

## 2022-03-05 DIAGNOSIS — K219 Gastro-esophageal reflux disease without esophagitis: Secondary | ICD-10-CM | POA: Diagnosis present

## 2022-03-05 DIAGNOSIS — Z9889 Other specified postprocedural states: Secondary | ICD-10-CM | POA: Insufficient documentation

## 2022-03-05 LAB — ECHOCARDIOGRAM COMPLETE
Area-P 1/2: 3.21 cm2
Calc EF: 62.6 %
S' Lateral: 2.7 cm
Single Plane A2C EF: 60.2 %
Single Plane A4C EF: 65.2 %

## 2022-03-05 LAB — COMPREHENSIVE METABOLIC PANEL
ALT: 37 U/L (ref 0–44)
AST: 87 U/L — ABNORMAL HIGH (ref 15–41)
Albumin: 3.8 g/dL (ref 3.5–5.0)
Alkaline Phosphatase: 76 U/L (ref 38–126)
Anion gap: 12 (ref 5–15)
BUN: 30 mg/dL — ABNORMAL HIGH (ref 8–23)
CO2: 24 mmol/L (ref 22–32)
Calcium: 9 mg/dL (ref 8.9–10.3)
Chloride: 105 mmol/L (ref 98–111)
Creatinine, Ser: 0.76 mg/dL (ref 0.44–1.00)
GFR, Estimated: >60 mL/min (ref >60)
Glucose, Bld: 118 mg/dL — ABNORMAL HIGH (ref 70–99)
Potassium: 3.1 mmol/L — ABNORMAL LOW (ref 3.5–5.1)
Sodium: 141 mmol/L (ref 135–145)
Total Bilirubin: 0.6 mg/dL (ref 0.3–1.2)
Total Protein: 6.7 g/dL (ref 6.5–8.1)

## 2022-03-05 LAB — CBC
HCT: 37.9 % (ref 36.0–46.0)
Hemoglobin: 12.1 g/dL (ref 12.0–15.0)
MCH: 29.4 pg (ref 26.0–34.0)
MCHC: 31.9 g/dL (ref 30.0–36.0)
MCV: 92 fL (ref 80.0–100.0)
Platelets: 281 10*3/uL (ref 150–400)
RBC: 4.12 MIL/uL (ref 3.87–5.11)
RDW: 12.8 % (ref 11.5–15.5)
WBC: 9.2 10*3/uL (ref 4.0–10.5)
nRBC: 0 % (ref 0.0–0.2)

## 2022-03-05 LAB — URINALYSIS, ROUTINE W REFLEX MICROSCOPIC
Bilirubin Urine: NEGATIVE
Glucose, UA: NEGATIVE mg/dL
Ketones, ur: NEGATIVE mg/dL
Leukocytes,Ua: NEGATIVE
Nitrite: NEGATIVE
Protein, ur: NEGATIVE mg/dL
Specific Gravity, Urine: 1.014 (ref 1.005–1.030)
pH: 5 (ref 5.0–8.0)

## 2022-03-05 LAB — LIPID PANEL
Cholesterol: 176 mg/dL (ref 0–200)
HDL: 45 mg/dL (ref >40)
LDL Cholesterol: 116 mg/dL — ABNORMAL HIGH (ref 0–99)
Total CHOL/HDL Ratio: 3.9 RATIO
Triglycerides: 76 mg/dL (ref <150)
VLDL: 15 mg/dL (ref 0–40)

## 2022-03-05 LAB — MAGNESIUM: Magnesium: 1.7 mg/dL (ref 1.7–2.4)

## 2022-03-05 LAB — PHOSPHORUS: Phosphorus: 4.2 mg/dL (ref 2.5–4.6)

## 2022-03-05 LAB — LIPASE, BLOOD: Lipase: 2379 U/L — ABNORMAL HIGH (ref 11–51)

## 2022-03-05 MED ORDER — METHOCARBAMOL 1000 MG/10ML IJ SOLN: 1000.0000 mg | Freq: Four times a day (QID) | INTRAVENOUS | Status: DC | PRN

## 2022-03-05 MED ORDER — CALCIUM POLYCARBOPHIL 625 MG PO TABS
625.0000 mg | ORAL_TABLET | Freq: Two times a day (BID) | ORAL | Status: DC
  Administered 2022-03-05 – 2022-03-07 (×5): 625 mg via ORAL
  Filled 2022-03-05 (×5): qty 1

## 2022-03-05 MED ORDER — SODIUM CHLORIDE 0.9 % IV SOLN: 8.0000 mg | Freq: Four times a day (QID) | INTRAVENOUS | Status: DC | PRN

## 2022-03-05 MED ORDER — MAGIC MOUTHWASH: 15.0000 mL | Freq: Four times a day (QID) | ORAL | Status: DC | PRN

## 2022-03-05 MED ORDER — DIAZEPAM 5 MG/ML IJ SOLN: 3.0000 mg | Freq: Once | INTRAMUSCULAR | Status: DC | PRN

## 2022-03-05 MED ORDER — LACTATED RINGERS IV BOLUS
1000.0000 mL | Freq: Once | INTRAVENOUS | Status: AC
  Administered 2022-03-05: 1000 mL via INTRAVENOUS

## 2022-03-05 MED ORDER — METHOCARBAMOL 500 MG PO TABS
1000.0000 mg | ORAL_TABLET | Freq: Four times a day (QID) | ORAL | Status: DC | PRN
  Administered 2022-03-07: 1000 mg via ORAL
  Filled 2022-03-05 (×3): qty 2

## 2022-03-05 MED ORDER — GADOBUTROL 1 MMOL/ML IV SOLN
10.0000 mL | Freq: Once | INTRAVENOUS | Status: AC | PRN
  Administered 2022-03-05: 10 mL via INTRAVENOUS

## 2022-03-05 MED ORDER — HYDROMORPHONE HCL 1 MG/ML IJ SOLN: 1.0000 mg | INTRAMUSCULAR | Status: DC | PRN

## 2022-03-05 MED ORDER — LACTATED RINGERS IV BOLUS: 1000.0000 mL | Freq: Three times a day (TID) | INTRAVENOUS | Status: AC | PRN

## 2022-03-05 MED ORDER — POTASSIUM CHLORIDE IN NACL 20-0.9 MEQ/L-% IV SOLN
INTRAVENOUS | Status: DC
  Filled 2022-03-05 (×3): qty 1000

## 2022-03-05 MED ORDER — ACETAMINOPHEN 650 MG RE SUPP: 650.0000 mg | Freq: Four times a day (QID) | RECTAL | Status: DC | PRN

## 2022-03-05 MED ORDER — HYDROMORPHONE HCL 1 MG/ML IJ SOLN: 0.5000 mg | INTRAMUSCULAR | Status: DC | PRN

## 2022-03-05 MED ORDER — POTASSIUM CHLORIDE 10 MEQ/100ML IV SOLN
10.0000 meq | INTRAVENOUS | Status: AC
  Administered 2022-03-05 (×3): 10 meq via INTRAVENOUS
  Filled 2022-03-05 (×3): qty 100

## 2022-03-05 MED ORDER — OXYCODONE HCL 5 MG PO TABS: 5.0000 mg | ORAL_TABLET | ORAL | Status: DC | PRN

## 2022-03-05 MED ORDER — ALUM & MAG HYDROXIDE-SIMETH 200-200-20 MG/5ML PO SUSP: 30.0000 mL | Freq: Four times a day (QID) | ORAL | Status: DC | PRN

## 2022-03-05 MED ORDER — ONDANSETRON HCL 4 MG/2ML IJ SOLN
4.0000 mg | Freq: Once | INTRAMUSCULAR | Status: AC | PRN
  Administered 2022-03-05: 4 mg via INTRAVENOUS
  Filled 2022-03-05: qty 2

## 2022-03-05 MED ORDER — ONDANSETRON HCL 4 MG/2ML IJ SOLN
4.0000 mg | Freq: Once | INTRAMUSCULAR | Status: AC
  Administered 2022-03-05: 4 mg via INTRAVENOUS
  Filled 2022-03-05: qty 2

## 2022-03-05 MED ORDER — MORPHINE SULFATE (PF) 4 MG/ML IV SOLN
4.0000 mg | Freq: Once | INTRAVENOUS | Status: AC
  Administered 2022-03-05: 4 mg via INTRAVENOUS
  Filled 2022-03-05: qty 1

## 2022-03-05 MED ORDER — PANTOPRAZOLE SODIUM 40 MG PO TBEC
40.0000 mg | DELAYED_RELEASE_TABLET | Freq: Two times a day (BID) | ORAL | Status: DC
  Administered 2022-03-05 – 2022-03-07 (×5): 40 mg via ORAL
  Filled 2022-03-05 (×5): qty 1

## 2022-03-05 MED ORDER — SODIUM CHLORIDE 0.9 % IV SOLN: INTRAVENOUS | Status: DC | PRN

## 2022-03-05 MED ORDER — PROCHLORPERAZINE EDISYLATE 10 MG/2ML IJ SOLN: 10.0000 mg | Freq: Four times a day (QID) | INTRAMUSCULAR | Status: DC | PRN

## 2022-03-05 MED ORDER — LIP MEDEX EX OINT
TOPICAL_OINTMENT | Freq: Two times a day (BID) | CUTANEOUS | Status: DC
  Administered 2022-03-05: 1 via TOPICAL
  Filled 2022-03-05 (×2): qty 7

## 2022-03-05 MED ORDER — MENTHOL 3 MG MT LOZG: 1.0000 | LOZENGE | OROMUCOSAL | Status: DC | PRN

## 2022-03-05 MED ORDER — PHENOL 1.4 % MT LIQD: 2.0000 | OROMUCOSAL | Status: DC | PRN

## 2022-03-05 MED ORDER — ONDANSETRON HCL 4 MG/2ML IJ SOLN: 4.0000 mg | Freq: Four times a day (QID) | INTRAMUSCULAR | Status: DC | PRN

## 2022-03-05 MED ORDER — BISACODYL 10 MG RE SUPP: 10.0000 mg | Freq: Two times a day (BID) | RECTAL | Status: DC | PRN

## 2022-03-05 MED ORDER — ACETAMINOPHEN 325 MG PO TABS: 325.0000 mg | ORAL_TABLET | Freq: Four times a day (QID) | ORAL | Status: DC | PRN

## 2022-03-05 MED ORDER — OXYCODONE HCL 5 MG PO TABS
5.0000 mg | ORAL_TABLET | ORAL | Status: DC | PRN
  Filled 2022-03-05 (×4): qty 1

## 2022-03-05 MED ORDER — PROCHLORPERAZINE EDISYLATE 10 MG/2ML IJ SOLN: 5.0000 mg | INTRAMUSCULAR | Status: DC | PRN

## 2022-03-05 NOTE — ED Notes (Signed)
Pt in b/r, not in room at this time. Family at [facility]. Preparing for transport.

## 2022-03-05 NOTE — H&P (Signed)
History and Physical    Patient: Casey Larsen [id number] DOB: October 10, 1955 DOA: 03/05/2022 DOS: the patient was seen and examined on 03/05/2022 PCP: Burnard Bunting, MD  Patient coming from: Home  Chief Complaint:  Chief Complaint  Patient presents with   Abdominal Pain   HPI: Casey Larsen is a 67 y.o. female with medical history significant of osteoarthritis of the knees, asthmatic bronchitis, depression, nail dermatophytosis, diverticulosis, GERD, hyperlipidemia, hypertension, melanoma of the face, history of pneumonia, pulmonary nodules, UTI who presented to the emergency department with complaints of intermittent abdominal pain for the past 2 weeks that became significantly worse yesterday evening after she ate junk food during the [place].  She has been nauseous, but no emesis.   No  diarrhea, constipation, melena or hematochezia.No fever, chills or night sweats. No sore throat, rhinorrhea, dyspnea, wheezing or hemoptysis.  No chest pain, palpitations, diaphoresis, PND, orthopnea or pitting edema of the lower extremities.   No flank pain, dysuria, frequency or hematuria.  No polyuria, polydipsia, polyphagia or blurred vision.  Lab work: Urinalysis with small hemoglobinuria and rare bacteria.  CBC was normal with a white count 9.2, hemoglobin 12.1 g/dL platelets 281.  Lipase 2379 units/L.  Lipid panel was normal except for an LDL cholesterol of 116 mg/dL.  CMP showed a potassium of 3.1 mmol/L, AST 87 units/L, glucose 118 and BUN of 30 mg/dL.  The rest of the CMP measurements were normal.  Imaging: RUQ ultrasound showed cholelithiasis without evidence of acute cholecystitis.  Prominent CBD at 7.7 mm.  No intrahepatic bile duct dilatation.  Hepatic steatosis.  ED course: Initial vital signs were temperature 97.5 F, pulse 67, respirations 16, BP 84/73 mmHg O2 sat 96% on room air.  The patient received LR 1000 mL bolus morphine 4 mg IVP x 1, ondansetron 4 mg IVP x 2.   Review of  Systems: As mentioned in the history of present illness. All other systems reviewed and are negative. Past Medical History:  Diagnosis Date   Arthritis    knees   Asthmatic bronchitis, mild intermittent, with acute exacerbation    Depression    with death of spouse and son   Dermatophytosis of nail 03/12/2012   Diverticulosis    GERD (gastroesophageal reflux disease)    Hyperlipidemia    Hypertension    Melanoma ([place]) 2005   face   Pneumonia    PONV (postoperative nausea and vomiting)    Pulmonary nodules    UTI (urinary tract infection)    Completed Cipro Sept 25th from primary care MD   Past Surgical History:  Procedure Laterality Date   ABDOMINAL HYSTERECTOMY  1998   bone spurs  2010, 2012   both feet   BREAST BIOPSY Right 2011   right, benign   BREAST EXCISIONAL BIOPSY Right 2012   COLONOSCOPY  05/22/2011   Dr.Perry   JOINT REPLACEMENT  2012   left foot   KNEE ARTHROSCOPY  2000,2003   right and left   TOTAL KNEE ARTHROPLASTY Left 10/27/2013   Procedure: LEFT TOTAL KNEE ARTHROPLASTY;  Surgeon: Mauri Pole, MD;  Location: [facility];  Service: Orthopedics;  Laterality: Left;   TOTAL KNEE ARTHROPLASTY Right 07/18/2018   Procedure: TOTAL KNEE ARTHROPLASTY;  Surgeon: Sydnee Cabal, MD;  Location: [facility];  Service: Orthopedics;  Laterality: Right;  adductor canal block   Social History:  reports that she quit smoking about 16 years ago. Her smoking use included cigarettes. She has never used smokeless tobacco. She  reports current alcohol use. She reports that she does not use drugs.  No Known Allergies  Family History  Problem Relation Age of Onset   Breast cancer Mother    Colon cancer Maternal Grandmother    Diabetes Mellitus I Son    Esophageal cancer Neg Hx    Stomach cancer Neg Hx    Rectal cancer Neg Hx     Prior to Admission medications   Medication Sig Start Date End Date Taking? Authorizing Provider  Cholecalciferol (VITAMIN D) 50 MCG (2000 UT) tablet  Take 2,000 Units by mouth daily.   Yes [provider]  GLUCOSAMINE-CHONDROITIN PO Take 1 tablet by mouth daily.   Yes [provider]  lisinopril-hydrochlorothiazide (PRINZIDE,ZESTORETIC) 20-12.5 MG per tablet Take 1 tablet by mouth in the morning and at bedtime.   Yes [provider]  omeprazole (PRILOSEC) 20 MG capsule Take 20 mg by mouth every morning.    Yes [provider]  Na Sulfate-K Sulfate-Mg Sulf 17.5-3.13-1.6 GM/177ML SOLN Take 1 kit by mouth as directed. May use generic SUPREP;NO prior authorizations will be done.Please use Singlecare or GOOD-RX coupon. Patient not taking: Reported on 03/05/2022 07/27/21   Burnard Bunting, MD  Naproxen Sod-diphenhydrAMINE (ALEVE PM PO) Take by mouth.    [provider]    Physical Exam: Vitals:   03/05/22 0310 03/05/22 0331 03/05/22 0700  BP: (!) 84/73 (!) 123/96 (!) 153/80  Pulse: 67  71  Resp: 16  18  Temp: (!) 97.5 F (36.4 C)    TempSrc: Oral    SpO2: 96%  95%   Physical Exam Vitals and nursing note reviewed.  Constitutional:      General: She is awake. She is not in acute distress.    Appearance: She is obese.  HENT:     Head: Normocephalic.     Nose: No rhinorrhea.     Mouth/Throat:     Mouth: Mucous membranes are moist.  Eyes:     General: No scleral icterus.    Pupils: Pupils are equal, round, and reactive to light.  Cardiovascular:     Rate and Rhythm: Normal rate and regular rhythm.  Pulmonary:     Effort: Pulmonary effort is normal.     Breath sounds: Normal breath sounds.  Abdominal:     General: There is no distension.     Palpations: Abdomen is soft.     Tenderness: There is abdominal tenderness in the epigastric area. There is no right CVA tenderness, left CVA tenderness, guarding or rebound.  Musculoskeletal:     Cervical back: Neck supple.     Right lower leg: No edema.     Left lower leg: No edema.  Skin:    General: Skin is warm and dry.  Neurological:      General: No focal deficit present.     Mental Status: She is alert and oriented to person, place, and time.  Psychiatric:        Mood and Affect: Mood normal.        Behavior: Behavior normal. Behavior is cooperative.     Data Reviewed:  Results are pending, will review when available.  Assessment and Plan: Principal Problem:   Gallstone pancreatitis Inpatient/MedSurg. Continue IV fluids. Clear liquid diet. N.p.o. after midnight. Analgesics as needed. Antiemetics as needed. Pantoprazole 40 mg IVP daily. Follow CBC, CMP and lipase in AM. Obtain MRCP to visualize CBD/biliary tree. Gastroenterology consult appreciated. General surgery input appreciated.  Active Problems:   GERD (gastroesophageal  reflux disease) Currently on pantoprazole 40 mg IVP.    Asthmatic bronchitis, mild intermittent, with acute exacerbation Asymptomatic at this time. Short acting beta agonist as needed. Follow-up with PCP and/or pulmonology as an outpatient.    Diverticulosis Seen by GI earlier. High-fiber diet once cleared for regular oral intake. Follow-up with gastroenterology as an outpatient.    Hyperlipidemia Currently not on medication. Follow-up with primary care provider.     Advance Care Planning:   Code Status: Full Code   Consults: [facility] Silvano Rusk, MD)  Family Communication:   Severity of Illness: The appropriate patient status for this patient is INPATIENT. Inpatient status is judged to be reasonable and necessary in order to provide the required intensity of service to ensure the patient's safety. The patient's presenting symptoms, physical exam findings, and initial radiographic and laboratory data in the context of their chronic comorbidities is felt to place them at high risk for further clinical deterioration. Furthermore, it is not anticipated that the patient will be medically stable for discharge from the hospital within 2 midnights of admission.   * I certify  that at the point of admission it is my clinical judgment that the patient will require inpatient hospital care spanning beyond 2 midnights from the point of admission due to high intensity of service, high risk for further deterioration and high frequency of surveillance required.*  Author: Reubin Milan, MD 03/05/2022 7:13 AM  For on call review [email or web address].   This document was prepared using Dragon voice recognition software and may contain some unintended transcription errors.

## 2022-03-05 NOTE — Progress Notes (Signed)
Echocardiogram 2D Echocardiogram has been performed.  Casey Larsen 03/05/2022, 3:05 PM

## 2022-03-05 NOTE — Progress Notes (Signed)
This is a 67 year old female with past medical history of depression, hyperlipidemia, hypertension and pulmonary nodules. Approximately 2 weeks ago she developed generalized abdominal pain that waxed and waned.  It became significantly worse last night after she ate junk food for the [place].  Her pain is epigastric in location and constant.  Patient's lipase is 2,379. Abdominal ultrasound reads cholelithiasis without evidence of acute cholecystitis. Prominent common bile duct at 7.7 mm. No intrahepatic bile duct dilatation.  AST elevated 87, all other LFTs are normal.  Will add a lipid panel.  Surgeon Dr. Johney Maine and GI have both been contacted by the EDP.

## 2022-03-05 NOTE — ED Provider Notes (Signed)
[facility] Provider Note   CSN: [id number] Arrival date & time: 03/05/22  0300     History  Chief Complaint  Patient presents with   Abdominal Pain    Casey Larsen is a 67 y.o. female.  The history is provided by the patient and medical records.  Abdominal Pain Associated symptoms: nausea    67 year old female with history of hypertension, GERD, depression, hyperlipidemia, presenting to the ED with abdominal pain.  She reports tonight she ate some sausage dip and other unhealthy food for the [place], afterwards started having a lot of epigastric pain.  States he was deep and searing for about 20 minutes, lessened in severity but has remained persistent over the past few hours.  She has had some nausea but denies any vomiting.  She did have similar episode 2 weeks ago after eating as well but that was more short-lived.  She denies known hx of gallbladder disease.  Prior hysterectomy.  Given fentanyl en route without much relief of pain.  Home Medications Prior to Admission medications   Medication Sig Start Date End Date Taking? Authorizing Provider  Cholecalciferol (VITAMIN D) 50 MCG (2000 UT) tablet Take 2,000 Units by mouth daily.    [provider]  GLUCOSAMINE-CHONDROITIN PO Take 1 tablet by mouth daily.    [provider]  lisinopril-hydrochlorothiazide (PRINZIDE,ZESTORETIC) 20-12.5 MG per tablet Take 2 tablets by mouth every morning.     [provider]  Na Sulfate-K Sulfate-Mg Sulf 17.5-3.13-1.6 GM/177ML SOLN Take 1 kit by mouth as directed. May use generic SUPREP;NO prior authorizations will be done.Please use Singlecare or GOOD-RX coupon. 07/27/21   Burnard Bunting, MD  Naproxen Sod-diphenhydrAMINE (ALEVE PM PO) Take by mouth.    [provider]  omeprazole (PRILOSEC) 20 MG capsule Take 20 mg by mouth every morning.     [provider]      Allergies    Patient has no known  allergies.    Review of Systems   Review of Systems  Gastrointestinal:  Positive for abdominal pain and nausea.  All other systems reviewed and are negative.   Physical Exam Updated Vital Signs BP (!) 123/96   Pulse 67   Temp (!) 97.5 F (36.4 C) (Oral)   Resp 16   SpO2 96%   Physical Exam Vitals and nursing note reviewed.  Constitutional:      Appearance: She is well-developed.  HENT:     Head: Normocephalic and atraumatic.  Eyes:     Conjunctiva/sclera: Conjunctivae normal.     Pupils: Pupils are equal, round, and reactive to light.  Cardiovascular:     Rate and Rhythm: Normal rate and regular rhythm.     Heart sounds: Normal heart sounds.  Pulmonary:     Effort: Pulmonary effort is normal.     Breath sounds: Normal breath sounds.  Abdominal:     General: Bowel sounds are normal.     Palpations: Abdomen is soft.     Tenderness: There is abdominal tenderness in the epigastric area.  Musculoskeletal:        General: Normal range of motion.     Cervical back: Normal range of motion.  Skin:    General: Skin is warm and dry.  Neurological:     Mental Status: She is alert and oriented to person, place, and time.     ED Results / Procedures / Treatments   Labs (all labs ordered are listed, but only  abnormal results are displayed) Labs Reviewed  LIPASE, BLOOD - Abnormal; Notable for the following components:      Result Value   Lipase 2,379 (*)    All other components within normal limits  COMPREHENSIVE METABOLIC PANEL - Abnormal; Notable for the following components:   Potassium 3.1 (*)    Glucose, Bld 118 (*)    BUN 30 (*)    AST 87 (*)    All other components within normal limits  URINALYSIS, ROUTINE W REFLEX MICROSCOPIC - Abnormal; Notable for the following components:   Hgb urine dipstick SMALL (*)    Bacteria, UA RARE (*)    All other components within normal limits  CBC    EKG None  Radiology US Abdomen Limited RUQ (LIVER/GB)  Result Date:  03/05/2022 CLINICAL DATA:  [id number] with epigastric pain. EXAM: ULTRASOUND ABDOMEN LIMITED RIGHT UPPER QUADRANT COMPARISON:  None Available. FINDINGS: Gallbladder: There is a large 5 cm shadowing stone in the gallbladder fundus. The gallbladder free wall thickness is normal at 1.9 mm and there is no positive sonographic Murphy's sign or pericholecystic fluid. Common bile duct: Diameter: Prominent measuring 7.7 mm. No intrahepatic bile duct dilatation is seen. Liver: No focal lesion identified. There is increased parenchymal echogenicity consistent with steatosis. Portal vein is patent on color Doppler imaging with normal direction of blood flow towards the liver. Other: None. IMPRESSION: 1. Cholelithiasis without evidence of acute cholecystitis. 2. Prominent common bile duct at 7.7 mm. No intrahepatic bile duct dilatation. 3. Hepatic steatosis. Electronically Signed   By: Telford Nab M.D.   On: 03/05/2022 05:06    Procedures Procedures    Medications Ordered in ED Medications  lactated ringers bolus 1,000 mL (has no administration in time range)  lactated ringers bolus 1,000 mL (has no administration in time range)  HYDROmorphone (DILAUDID) injection 0.5-2 mg (has no administration in time range)  methocarbamol (ROBAXIN) 1,000 mg in dextrose 5 % 100 mL IVPB (has no administration in time range)  acetaminophen (TYLENOL) tablet 325-650 mg (has no administration in time range)  acetaminophen (TYLENOL) suppository 650 mg (has no administration in time range)  oxyCODONE (Oxy IR/ROXICODONE) immediate release tablet 5-10 mg (has no administration in time range)  methocarbamol (ROBAXIN) tablet 1,000 mg (has no administration in time range)  ondansetron (ZOFRAN) injection 4 mg (has no administration in time range)    Or  ondansetron (ZOFRAN) 8 mg in sodium chloride 0.9 % 50 mL IVPB (has no administration in time range)  prochlorperazine (COMPAZINE) injection 5-10 mg (has no administration in time range)   lip balm (CARMEX) ointment (has no administration in time range)  phenol (CHLORASEPTIC) mouth spray 2 spray (has no administration in time range)  menthol-cetylpyridinium (CEPACOL) lozenge 3 mg (has no administration in time range)  magic mouthwash (has no administration in time range)  alum & mag hydroxide-simeth (MAALOX/MYLANTA) 200-200-20 MG/5ML suspension 30 mL (has no administration in time range)  pantoprazole (PROTONIX) EC tablet 40 mg (has no administration in time range)  polycarbophil (FIBERCON) tablet 625 mg (has no administration in time range)  bisacodyl (DULCOLAX) suppository 10 mg (has no administration in time range)  ondansetron (ZOFRAN) injection 4 mg (4 mg Intravenous Given 03/05/22 0322)  morphine (PF) 4 MG/ML injection 4 mg (4 mg Intravenous Given 03/05/22 0356)  ondansetron (ZOFRAN) injection 4 mg (4 mg Intravenous Given 03/05/22 0356)    ED Course/ Medical Decision Making/ A&P  Medical Decision Making Amount and/or Complexity of Data Reviewed Labs: ordered. Radiology: ordered and independent interpretation performed. ECG/medicine tests: ordered and independent interpretation performed.  Risk Prescription drug management. Decision regarding hospitalization.   67 y.o. F here with epigastric pain after eating sausage dip and other junk food last night.  Similar 2 weeks ago but not this persistent.  Nausea wihtout vomiting.  Afebrile, non-toxic.  Does have some tenderness epigastric area.  No back pain.  Labs, RUQ [place] ordered.  Morphine/zofran given.  Labs as above-- no leukocytosis.  AST 74, normal bili and alk phos.  Lipase 2379.  RUQ with gallstones, no findings of acute cholecystitis.  Prominent CBD 7.44m.  Patient is pain free after single dose of morphine.  She will require admission.  Discussed with general surgery, Dr. GJohney Maine- surgery team will follow.  Also messaged on call [facility], Dr. GCarlean Purlfor AM Consult (has seen Dr. PHenrene Pastor with [facility] in the past).  Discussed with hospitalist, Dr. CClaria Dice- will admit for ongoing care.  Final Clinical Impression(s) / ED Diagnoses Final diagnoses:  Gallstone pancreatitis    Rx / DC Orders ED Discharge Orders     None         SLarene Pickett PA-C 03/05/22 [id number]   MShanon Rosser MD 03/05/22 [phone]

## 2022-03-05 NOTE — ED Triage Notes (Addendum)
Pt BIB [facility] from home. Reports epigastric pain. She ate some greasy food PTA. Still has gallbladder. A&Ox4. 18mg fentanyl en route

## 2022-03-05 NOTE — Consult Note (Signed)
Consultation  Referring Provider:     [facility] hospitalist Primary Care Physician:  Burnard Bunting, MD Primary Gastroenterologist:        Henrene Pastor Reason for Consultation:     Pancreatitis     Impression / Plan:   Gallstone pancreatitis  Slightly dilated common bile duct at best at 7.7 mm  ------------------------------------------------------------------------------------------------------------      I suspect she has passed a stone based upon the overall situation at hand.  I am supportive of the hospitalist decision to order an MRCP to evaluate for choledocholithiasis.  Await those results.     [name], MD, [facility] See Shea Evans on call - gastroenterology for best contact person 03/05/2022 2:19 PM     HPI:   Casey Larsen is a 67 y.o. female admitted overnight with a clinical scenario consistent with gallstone pancreatitis.  Developed acute onset of abdominal pain yesterday eating "junk food" for the Super Bowl.  Has of epigastric pain radiating into the back and left upper quadrant.  The pain intensified at about 2 in the morning she says and then remitted after that she feels much better though she is sore in her abdomen.  Nausea but no vomiting.  No defecation symptoms.  Past medical history as below.  No prior similar symptoms.  Past Medical History:  Diagnosis Date  . Arthritis    knees  . Asthmatic bronchitis, mild intermittent, with acute exacerbation   . Depression    with death of spouse and son  . Dermatophytosis of nail 03/12/2012  . Diverticulosis   . GERD (gastroesophageal reflux disease)   . Hyperlipidemia   . Hypertension   . Melanoma ([place]) 2005   face  . Pneumonia   . PONV (postoperative nausea and vomiting)   . Pulmonary nodules   . UTI (urinary tract infection)    Completed Cipro Sept 25th from primary care MD    Past Surgical History:  Procedure Laterality Date  . ABDOMINAL HYSTERECTOMY  1998  . bone spurs   2010, 2012   both feet  . BREAST BIOPSY Right 2011   right, benign  . BREAST EXCISIONAL BIOPSY Right 2012  . COLONOSCOPY  05/22/2011   Dr.Perry  . JOINT REPLACEMENT  2012   left foot  . KNEE ARTHROSCOPY  2000,2003   right and left  . TOTAL KNEE ARTHROPLASTY Left 10/27/2013   Procedure: LEFT TOTAL KNEE ARTHROPLASTY;  Surgeon: Mauri Pole, MD;  Location: [facility];  Service: Orthopedics;  Laterality: Left;  . TOTAL KNEE ARTHROPLASTY Right 07/18/2018   Procedure: TOTAL KNEE ARTHROPLASTY;  Surgeon: Sydnee Cabal, MD;  Location: [facility];  Service: Orthopedics;  Laterality: Right;  adductor canal block    Family History  Problem Relation Age of Onset  . Breast cancer Mother   . Colon cancer Maternal Grandmother   . Diabetes Mellitus I Son   . Esophageal cancer Neg Hx   . Stomach cancer Neg Hx   . Rectal cancer Neg Hx      Social History   Tobacco Use  . Smoking status: Former    Types: Cigarettes    Quit date: 05/07/2005    Years since quitting: 16.8  . Smokeless tobacco: Never  Vaping Use  . Vaping Use: Never used  Substance Use Topics  . Alcohol use: Yes    Comment: occasional  . Drug use: No    Prior to Admission medications   Medication Sig Start Date End Date Taking? Authorizing  Provider  Cholecalciferol (VITAMIN D) 50 MCG (2000 UT) tablet Take 2,000 Units by mouth daily.   Yes [provider]  GLUCOSAMINE-CHONDROITIN PO Take 1 tablet by mouth daily.   Yes [provider]  lisinopril-hydrochlorothiazide (PRINZIDE,ZESTORETIC) 20-12.5 MG per tablet Take 1 tablet by mouth in the morning and at bedtime.   Yes [provider]  omeprazole (PRILOSEC) 20 MG capsule Take 20 mg by mouth every morning.    Yes [provider]  Na Sulfate-K Sulfate-Mg Sulf 17.5-3.13-1.6 GM/177ML SOLN Take 1 kit by mouth as directed. May use generic SUPREP;NO prior authorizations will be done.Please use Singlecare or GOOD-RX coupon. Patient not taking: Reported  on 03/05/2022 07/27/21   Burnard Bunting, MD  Naproxen Sod-diphenhydrAMINE (ALEVE PM PO) Take by mouth.    [provider]    Current Facility-Administered Medications  Medication Dose Route Frequency Provider Last Rate Last Admin  . 0.9 %  sodium chloride infusion   Intravenous PRN Reubin Milan, MD      . 0.9 % NaCl with KCl 20 mEq/ L  infusion   Intravenous Continuous Reubin Milan, MD 125 mL/hr at 03/05/22 0907 New Bag at 03/05/22 0907  . acetaminophen (TYLENOL) suppository 650 mg  650 mg Rectal Q6H PRN Reubin Milan, MD      . acetaminophen (TYLENOL) tablet 325-650 mg  325-650 mg Oral Q6H PRN Reubin Milan, MD      . alum & mag hydroxide-simeth (MAALOX/MYLANTA) 200-200-20 MG/5ML suspension 30 mL  30 mL Oral Q6H PRN Reubin Milan, MD      . bisacodyl (DULCOLAX) suppository 10 mg  10 mg Rectal Q12H PRN Reubin Milan, MD      . diazepam (VALIUM) injection 3 mg  3 mg Intravenous Once PRN Reubin Milan, MD      . HYDROmorphone (DILAUDID) injection 1 mg  1 mg Intravenous Q4H PRN Reubin Milan, MD      . lactated ringers bolus 1,000 mL  1,000 mL Intravenous Q8H PRN Reubin Milan, MD      . lip balm (CARMEX) ointment   Topical BID Reubin Milan, MD      . magic mouthwash  15 mL Oral QID PRN Reubin Milan, MD      . menthol-cetylpyridinium (CEPACOL) lozenge 3 mg  1 lozenge Oral PRN Reubin Milan, MD      . methocarbamol (ROBAXIN) 1,000 mg in dextrose 5 % 100 mL IVPB  1,000 mg Intravenous Q6H PRN Reubin Milan, MD      . methocarbamol (ROBAXIN) tablet 1,000 mg  1,000 mg Oral Q6H PRN Reubin Milan, MD      . ondansetron [facility]) injection 4 mg  4 mg Intravenous Q6H PRN Reubin Milan, MD       Or  . ondansetron [facility]) 8 mg in sodium chloride 0.9 % 50 mL IVPB  8 mg Intravenous Q6H PRN Reubin Milan, MD      . oxyCODONE (Oxy IR/ROXICODONE) immediate release tablet 5 mg  5 mg Oral Q4H PRN Reubin Milan, MD      . pantoprazole (PROTONIX) EC tablet 40 mg  40 mg Oral BID Reubin Milan, MD   40 mg at 03/05/22 1019  . phenol (CHLORASEPTIC) mouth spray 2 spray  2 spray Mouth/Throat PRN Reubin Milan, MD      . polycarbophil [facility]) tablet 625 mg  625 mg Oral BID Reubin Milan, MD  625 mg at 03/05/22 1019  . prochlorperazine (COMPAZINE) injection 10 mg  10 mg Intravenous Q6H PRN Reubin Milan, MD        Allergies as of 03/05/2022  . (No Known Allergies)     Review of Systems:    This is positive for those things mentioned in the HPI. All other review of systems are negative.       Physical Exam:  Vital signs in last 24 hours: Temp:  [97.5 F (36.4 C)-98.4 F (36.9 C)] 98.4 F (36.9 C) (02/12 1414) Pulse Rate:  [67-89] 82 (02/12 1414) Resp:  [14-18] 14 (02/12 1414) BP: (84-168)/(73-96) 156/75 (02/12 1414) SpO2:  [92 %-98 %] 94 % (02/12 1414)    General:  Mildly ill white woman in no acute distress Eyes:  anicteric. Lungs: Clear to auscultation bilaterally. Heart:   S1S2, no rubs, murmurs, gallops. Abdomen:  soft, mildly tender epigastrium, no hepatosplenomegaly, hernia, or mass and BS+.  Neuro:  A&O x 3.  Psych:  appropriate mood and  Affect.   Data Reviewed:   LAB RESULTS: Recent Labs    03/05/22 0314  WBC 9.2  HGB 12.1  HCT 37.9  PLT 281   BMET Recent Labs    03/05/22 0314  NA 141  K 3.1*  CL 105  CO2 24  GLUCOSE 118*  BUN 30*  CREATININE 0.76  CALCIUM 9.0   LFT Recent Labs    03/05/22 0314  PROT 6.7  ALBUMIN 3.8  AST 87*  ALT 37  ALKPHOS 76  BILITOT 0.6   Lipase 2379 Triglycerides 76 STUDIES: US Abdomen Limited RUQ (LIVER/GB)  Result Date: 03/05/2022 CLINICAL DATA:  [id number] with epigastric pain. EXAM: ULTRASOUND ABDOMEN LIMITED RIGHT UPPER QUADRANT COMPARISON:  None Available. FINDINGS: Gallbladder: There is a large 5 cm shadowing stone in the gallbladder fundus. The gallbladder free wall thickness  is normal at 1.9 mm and there is no positive sonographic Murphy's sign or pericholecystic fluid. Common bile duct: Diameter: Prominent measuring 7.7 mm. No intrahepatic bile duct dilatation is seen. Liver: No focal lesion identified. There is increased parenchymal echogenicity consistent with steatosis. Portal vein is patent on color Doppler imaging with normal direction of blood flow towards the liver. Other: None. IMPRESSION: 1. Cholelithiasis without evidence of acute cholecystitis. 2. Prominent common bile duct at 7.7 mm. No intrahepatic bile duct dilatation. 3. Hepatic steatosis. Electronically Signed   By: Telford Nab M.D.   On: 03/05/2022 05:06     PREVIOUS ENDOSCOPIES:            Colonoscopy for screening July 2023 diverticulosis and hemorrhoids   Thanks   LOS: 0 days   @Isack Lavalley$  Simonne Maffucci, MD, [facility] @  03/05/2022, 2:14 PM

## 2022-03-05 NOTE — Progress Notes (Signed)
Mobility Specialist - Progress Note   03/05/22 0901  Mobility  Activity Ambulated with assistance in hallway  Level of Assistance Independent after set-up  Assistive Device Other (Comment) (IV Pole)  Distance Ambulated (ft) 500 ft  Activity Response Tolerated well  Mobility Referral Yes  $Mobility charge 1 Mobility   Pt received in bed and agreeable to mobility. No complaints during session. Pt to bathroom after session with all needs met.    [facility]

## 2022-03-05 NOTE — Consult Note (Signed)
Admission Note  Casey Larsen [birth year]  [id number].    Requesting MD: Reubin Milan, MD Chief Complaint/Reason for Consult: biliary pancreatitis  HPI:  Patient is a 67 year old female who presented to the ED with acute onset abdominal pain yesterday evening after eating junk food for the super bowl. Pain is epigastric and radiated some to LUQ. Associated nausea without emesis. No fever, chills, diarrhea, urinary symptoms. Patient has had a similar episode of less severe pain about 3 weeks ago that resolved on its own with time. She has a sister who told her it might be her gallbladder because she had similar symptoms and had to have cholecystectomy. Patient is currently feeling much better and has some mild discomfort but that is all. PMH otherwise significant for HTN, HLD, GERD and depression. Prior abdominal surgery includes abdominal hysterectomy. She is not on any blood thinners and has NKDA.   ROS: Negative other than HPI.   Family History  Problem Relation Age of Onset   Breast cancer Mother    Colon cancer Maternal Grandmother    Diabetes Mellitus I Son    Esophageal cancer Neg Hx    Stomach cancer Neg Hx    Rectal cancer Neg Hx     Past Medical History:  Diagnosis Date   Arthritis    knees   Asthmatic bronchitis, mild intermittent, with acute exacerbation    Depression    with death of spouse and son   Dermatophytosis of nail 03/12/2012   Diverticulosis    GERD (gastroesophageal reflux disease)    Hyperlipidemia    Hypertension    Melanoma ([place]) 2005   face   Pneumonia    PONV (postoperative nausea and vomiting)    Pulmonary nodules    UTI (urinary tract infection)    Completed Cipro Sept 25th from primary care MD    Past Surgical History:  Procedure Laterality Date   ABDOMINAL HYSTERECTOMY  1998   bone spurs  2010, 2012   both feet   BREAST BIOPSY Right 2011   right, benign   BREAST EXCISIONAL BIOPSY Right 2012   COLONOSCOPY   05/22/2011   Dr.Perry   JOINT REPLACEMENT  2012   left foot   KNEE ARTHROSCOPY  2000,2003   right and left   TOTAL KNEE ARTHROPLASTY Left 10/27/2013   Procedure: LEFT TOTAL KNEE ARTHROPLASTY;  Surgeon: Mauri Pole, MD;  Location: [facility];  Service: Orthopedics;  Laterality: Left;   TOTAL KNEE ARTHROPLASTY Right 07/18/2018   Procedure: TOTAL KNEE ARTHROPLASTY;  Surgeon: Sydnee Cabal, MD;  Location: [facility];  Service: Orthopedics;  Laterality: Right;  adductor canal block    Social History:  reports that she quit smoking about 16 years ago. Her smoking use included cigarettes. She has never used smokeless tobacco. She reports current alcohol use. She reports that she does not use drugs.  Allergies: No Known Allergies  Facility-Administered Medications Prior to Admission  Medication Dose Route Frequency Provider Last Rate Last Admin   0.9 %  sodium chloride infusion  500 mL Intravenous Continuous Irene Shipper, MD       Medications Prior to Admission  Medication Sig Dispense Refill   Cholecalciferol (VITAMIN D) 50 MCG (2000 UT) tablet Take 2,000 Units by mouth daily.     GLUCOSAMINE-CHONDROITIN PO Take 1 tablet by mouth daily.     lisinopril-hydrochlorothiazide (PRINZIDE,ZESTORETIC) 20-12.5 MG per tablet Take 1 tablet by mouth in the morning and at bedtime.  omeprazole (PRILOSEC) 20 MG capsule Take 20 mg by mouth every morning.      Na Sulfate-K Sulfate-Mg Sulf 17.5-3.13-1.6 GM/177ML SOLN Take 1 kit by mouth as directed. May use generic SUPREP;NO prior authorizations will be done.Please use Singlecare or GOOD-RX coupon. (Patient not taking: Reported on 03/05/2022) 354 mL 0   Naproxen Sod-diphenhydrAMINE (ALEVE PM PO) Take by mouth.      Blood pressure (!) 164/88, pulse 79, temperature 97.8 F (36.6 C), temperature source Oral, resp. rate 16, SpO2 95 %. Physical Exam:  General: pleasant, WD, obese female who is laying in bed in NAD HEENT: head is normocephalic, atraumatic.  Sclera  are anicteric.  Ears and nose without any masses or lesions.  Mouth is pink and moist Heart: regular, rate, and rhythm.  Normal s1,s2. No obvious murmurs, gallops, or rubs noted.  Palpable radial and pedal pulses bilaterally Lungs: CTAB, no wheezes, rhonchi, or rales noted.  Respiratory effort nonlabored Abd: soft, NT, mild epigastric fullness, +BS, no masses, hernias, or organomegaly MS: all 4 extremities are symmetrical with no cyanosis, clubbing, or edema. Skin: warm and dry with no masses, lesions, or rashes Neuro: Cranial nerves 2-12 grossly intact, sensation is normal throughout Psych: A&Ox3 with an appropriate affect.   Results for orders placed or performed during the hospital encounter of 03/05/22 (from the past 48 hour(s))  Lipase, blood     Status: Abnormal   Collection Time: 03/05/22  3:14 AM  Result Value Ref Range   Lipase 2,379 (H) 11 - 51 U/L    Comment: RESULTS CONFIRMED BY MANUAL DILUTION Performed at [facility] [address] [place], [place] [address]   Comprehensive metabolic panel     Status: Abnormal   Collection Time: 03/05/22  3:14 AM  Result Value Ref Range   Sodium 141 135 - 145 mmol/L   Potassium 3.1 (L) 3.5 - 5.1 mmol/L   Chloride 105 98 - 111 mmol/L   CO2 24 22 - 32 mmol/L   Glucose, Bld 118 (H) 70 - 99 mg/dL    Comment: Glucose reference range applies only to samples taken after fasting for at least 8 hours.   BUN 30 (H) 8 - 23 mg/dL   Creatinine, Ser 0.76 0.44 - 1.00 mg/dL   Calcium 9.0 8.9 - 10.3 mg/dL   Total Protein 6.7 6.5 - 8.1 g/dL   Albumin 3.8 3.5 - 5.0 g/dL   AST 87 (H) 15 - 41 U/L   ALT 37 0 - 44 U/L   Alkaline Phosphatase 76 38 - 126 U/L   Total Bilirubin 0.6 0.3 - 1.2 mg/dL   GFR, Estimated >60 >60 mL/min    Comment: (NOTE) Calculated using the CKD-EPI Creatinine Equation (2021)    Anion gap 12 5 - 15    Comment: Performed at [facility], [place] [address], [place], [place] [address]  CBC      Status: None   Collection Time: 03/05/22  3:14 AM  Result Value Ref Range   WBC 9.2 4.0 - 10.5 K/uL   RBC 4.12 3.87 - 5.11 MIL/uL   Hemoglobin 12.1 12.0 - 15.0 g/dL   HCT 37.9 36.0 - 46.0 %   MCV 92.0 80.0 - 100.0 fL   MCH 29.4 26.0 - 34.0 pg   MCHC 31.9 30.0 - 36.0 g/dL   RDW 12.8 11.5 - 15.5 %   Platelets 281 150 - 400 K/uL   nRBC 0.0 0.0 - 0.2 %    Comment:  Performed at [facility], [place] [address] [place], [place] [address]  Lipid panel     Status: Abnormal   Collection Time: 03/05/22  3:14 AM  Result Value Ref Range   Cholesterol 176 0 - 200 mg/dL   Triglycerides 76 <150 mg/dL   HDL 45 >40 mg/dL   Total CHOL/HDL Ratio 3.9 RATIO   VLDL 15 0 - 40 mg/dL   LDL Cholesterol 116 (H) 0 - 99 mg/dL    Comment:        Total Cholesterol/HDL:CHD Risk Coronary Heart Disease Risk Table                     Men   Women  1/2 Average Risk   3.4   3.3  Average Risk       5.0   4.4  2 X Average Risk   9.6   7.1  3 X Average Risk  23.4   11.0        Use the calculated Patient Ratio above and the CHD Risk Table to determine the patient's CHD Risk.        ATP III CLASSIFICATION (LDL):  <100     mg/dL   Optimal  100-129  mg/dL   Near or Above                    Optimal  130-159  mg/dL   Borderline  160-189  mg/dL   High  >190     mg/dL   Very High Performed at [facility] [address] [place], [place] [address]   Magnesium     Status: None   Collection Time: 03/05/22  3:25 AM  Result Value Ref Range   Magnesium 1.7 1.7 - 2.4 mg/dL    Comment: Performed at [facility], [place] [address] [place], [place] [address]  Phosphorus     Status: None   Collection Time: 03/05/22  3:25 AM  Result Value Ref Range   Phosphorus 4.2 2.5 - 4.6 mg/dL    Comment: Performed at [facility], [place] [address], [place], [place] [address]  Urinalysis, Routine w reflex microscopic -Urine, Clean Catch     Status: Abnormal    Collection Time: 03/05/22  4:10 AM  Result Value Ref Range   Color, Urine YELLOW YELLOW   APPearance CLEAR CLEAR   Specific Gravity, Urine 1.014 1.005 - 1.030   pH 5.0 5.0 - 8.0   Glucose, UA NEGATIVE NEGATIVE mg/dL   Hgb urine dipstick SMALL (A) NEGATIVE   Bilirubin Urine NEGATIVE NEGATIVE   Ketones, ur NEGATIVE NEGATIVE mg/dL   Protein, ur NEGATIVE NEGATIVE mg/dL   Nitrite NEGATIVE NEGATIVE   Leukocytes,Ua NEGATIVE NEGATIVE   RBC / HPF 0-5 0 - 5 RBC/hpf   WBC, UA 0-5 0 - 5 WBC/hpf   Bacteria, UA RARE (A) NONE SEEN   Squamous Epithelial / HPF 0-5 0 - 5 /HPF   Mucus PRESENT     Comment: Performed at [facility], [place] [address], [place], [place] [address]   US Abdomen Limited RUQ (LIVER/GB)  Result Date: 03/05/2022 CLINICAL DATA:  [id number] with epigastric pain. EXAM: ULTRASOUND ABDOMEN LIMITED RIGHT UPPER QUADRANT COMPARISON:  None Available. FINDINGS: Gallbladder: There is a large 5 cm shadowing stone in the gallbladder fundus. The gallbladder free wall thickness is normal at 1.9 mm and there is no positive sonographic Murphy's sign or pericholecystic fluid. Common bile duct:  Diameter: Prominent measuring 7.7 mm. No intrahepatic bile duct dilatation is seen. Liver: No focal lesion identified. There is increased parenchymal echogenicity consistent with steatosis. Portal vein is patent on color Doppler imaging with normal direction of blood flow towards the liver. Other: None. IMPRESSION: 1. Cholelithiasis without evidence of acute cholecystitis. 2. Prominent common bile duct at 7.7 mm. No intrahepatic bile duct dilatation. 3. Hepatic steatosis. Electronically Signed   By: Telford Nab M.D.   On: 03/05/2022 05:06      Assessment/Plan Biliary pancreatitis  - RUQ [place] with cholelithiasis and prominence of CBD - Lipase 2300 on admit with AST elevated at 87, Tbili 0.6 - GI to see this AM as well, may want MRCP to evaluate CBD and pancreatic parenchyma  - abdominal exam  reassuring but not ready for laparoscopic cholecystectomy today any ways given lipase - AM labs, NPO, IVF - await GI recs and will follow for timing of eventual laparoscopic cholecystectomy   FEN: NPO, IVF@125$  cc/h VTE: SCDs, ok to have LMWH or SQH from surgery standpoint  ID: no current abx, afeb and no leukocytosis   - below per primary -  HTN HLD GERD Depression   I reviewed ED provider notes, hospitalist notes, last 24 h vitals and pain scores, last 48 h intake and output, last 24 h labs and trends, and last 24 h imaging results.   Norm Parcel, [facility] Surgery 03/05/2022, 9:15 AM Please see Amion for pager number during day hours 7:00am-4:30pm

## 2022-03-05 NOTE — ED Notes (Signed)
Alert, NAD, calm, interactive. Pt ready for transport. Denies pain or nausea at this time. VSS. KCL infusion held for transport.

## 2022-03-06 ENCOUNTER — Inpatient Hospital Stay (HOSPITAL_COMMUNITY): Payer: Commercial Managed Care - PPO | Admitting: Certified Registered Nurse Anesthetist

## 2022-03-06 ENCOUNTER — Encounter (HOSPITAL_COMMUNITY)
Admission: EM | Disposition: A | Discharge status: Home / Self Care | Payer: Self-pay | Source: Home / Self Care | Attending: Emergency Medicine

## 2022-03-06 ENCOUNTER — Encounter (HOSPITAL_COMMUNITY): Payer: Self-pay | Admitting: Internal Medicine

## 2022-03-06 ENCOUNTER — Other Ambulatory Visit: Payer: Self-pay

## 2022-03-06 DIAGNOSIS — K5790 Diverticulosis of intestine, part unspecified, without perforation or abscess without bleeding: Secondary | ICD-10-CM | POA: Diagnosis not present

## 2022-03-06 DIAGNOSIS — E785 Hyperlipidemia, unspecified: Secondary | ICD-10-CM

## 2022-03-06 DIAGNOSIS — K219 Gastro-esophageal reflux disease without esophagitis: Secondary | ICD-10-CM

## 2022-03-06 DIAGNOSIS — K851 Biliary acute pancreatitis without necrosis or infection: Secondary | ICD-10-CM

## 2022-03-06 DIAGNOSIS — J4521 Mild intermittent asthma with (acute) exacerbation: Secondary | ICD-10-CM | POA: Diagnosis not present

## 2022-03-06 HISTORY — PX: CHOLECYSTECTOMY: SHX55

## 2022-03-06 LAB — COMPREHENSIVE METABOLIC PANEL
ALT: 73 U/L — ABNORMAL HIGH (ref 0–44)
AST: 61 U/L — ABNORMAL HIGH (ref 15–41)
Albumin: 3.2 g/dL — ABNORMAL LOW (ref 3.5–5.0)
Alkaline Phosphatase: 79 U/L (ref 38–126)
Anion gap: 9 (ref 5–15)
BUN: 20 mg/dL (ref 8–23)
CO2: 25 mmol/L (ref 22–32)
Calcium: 8.1 mg/dL — ABNORMAL LOW (ref 8.9–10.3)
Chloride: 107 mmol/L (ref 98–111)
Creatinine, Ser: 0.7 mg/dL (ref 0.44–1.00)
GFR, Estimated: >60 mL/min (ref >60)
Glucose, Bld: 98 mg/dL (ref 70–99)
Potassium: 3.6 mmol/L (ref 3.5–5.1)
Sodium: 141 mmol/L (ref 135–145)
Total Bilirubin: 1 mg/dL (ref 0.3–1.2)
Total Protein: 5.7 g/dL — ABNORMAL LOW (ref 6.5–8.1)

## 2022-03-06 LAB — CBC
HCT: 32.7 % — ABNORMAL LOW (ref 36.0–46.0)
Hemoglobin: 10.3 g/dL — ABNORMAL LOW (ref 12.0–15.0)
MCH: 29.4 pg (ref 26.0–34.0)
MCHC: 31.5 g/dL (ref 30.0–36.0)
MCV: 93.4 fL (ref 80.0–100.0)
Platelets: 210 10*3/uL (ref 150–400)
RBC: 3.5 MIL/uL — ABNORMAL LOW (ref 3.87–5.11)
RDW: 13.2 % (ref 11.5–15.5)
WBC: 6.6 10*3/uL (ref 4.0–10.5)
nRBC: 0 % (ref 0.0–0.2)

## 2022-03-06 LAB — LIPASE, BLOOD: Lipase: 105 U/L — ABNORMAL HIGH (ref 11–51)

## 2022-03-06 LAB — HIV ANTIBODY (ROUTINE TESTING W REFLEX): HIV Screen 4th Generation wRfx: NONREACTIVE

## 2022-03-06 SURGERY — LAPAROSCOPIC CHOLECYSTECTOMY WITH INTRAOPERATIVE CHOLANGIOGRAM
Anesthesia: General | Site: Abdomen

## 2022-03-06 MED ORDER — HYDROMORPHONE HCL 1 MG/ML IJ SOLN: 0.5000 mg | INTRAMUSCULAR | Status: DC | PRN

## 2022-03-06 MED ORDER — LACTATED RINGERS IV SOLN: INTRAVENOUS | Status: DC

## 2022-03-06 MED ORDER — FENTANYL CITRATE (PF) 100 MCG/2ML IJ SOLN
INTRAMUSCULAR | Status: AC
  Filled 2022-03-06: qty 2

## 2022-03-06 MED ORDER — BUPIVACAINE-EPINEPHRINE 0.25% -1:200000 IJ SOLN
INTRAMUSCULAR | Status: DC | PRN
  Administered 2022-03-06: 40 mL

## 2022-03-06 MED ORDER — ROCURONIUM BROMIDE 10 MG/ML (PF) SYRINGE
PREFILLED_SYRINGE | INTRAVENOUS | Status: DC | PRN
  Administered 2022-03-06: 60 mg via INTRAVENOUS

## 2022-03-06 MED ORDER — LIDOCAINE 2% (20 MG/ML) 5 ML SYRINGE
INTRAMUSCULAR | Status: DC | PRN
  Administered 2022-03-06: 100 mg via INTRAVENOUS

## 2022-03-06 MED ORDER — DOCUSATE SODIUM 100 MG PO CAPS
100.0000 mg | ORAL_CAPSULE | Freq: Two times a day (BID) | ORAL | Status: DC
  Administered 2022-03-07: 100 mg via ORAL
  Filled 2022-03-06 (×2): qty 1

## 2022-03-06 MED ORDER — OXYCODONE HCL 5 MG PO TABS
5.0000 mg | ORAL_TABLET | ORAL | Status: DC | PRN
  Administered 2022-03-06 – 2022-03-07 (×3): 5 mg via ORAL

## 2022-03-06 MED ORDER — BUPIVACAINE HCL 0.25 % IJ SOLN
INTRAMUSCULAR | Status: AC
  Filled 2022-03-06: qty 1

## 2022-03-06 MED ORDER — GABAPENTIN 300 MG PO CAPS
300.0000 mg | ORAL_CAPSULE | Freq: Three times a day (TID) | ORAL | Status: DC
  Administered 2022-03-06 – 2022-03-07 (×3): 300 mg via ORAL
  Filled 2022-03-06 (×3): qty 1

## 2022-03-06 MED ORDER — LACTATED RINGERS IR SOLN
Status: DC | PRN
  Administered 2022-03-06: 1000 mL

## 2022-03-06 MED ORDER — OXYCODONE HCL 5 MG/5ML PO SOLN: 5.0000 mg | Freq: Once | ORAL | Status: DC | PRN

## 2022-03-06 MED ORDER — HYDROMORPHONE HCL 1 MG/ML IJ SOLN: 0.2500 mg | INTRAMUSCULAR | Status: DC | PRN

## 2022-03-06 MED ORDER — PROCHLORPERAZINE EDISYLATE 10 MG/2ML IJ SOLN: 10.0000 mg | INTRAMUSCULAR | Status: DC | PRN

## 2022-03-06 MED ORDER — FENTANYL CITRATE (PF) 100 MCG/2ML IJ SOLN
INTRAMUSCULAR | Status: DC | PRN
  Administered 2022-03-06 (×2): 100 ug via INTRAVENOUS

## 2022-03-06 MED ORDER — SIMETHICONE 80 MG PO CHEW: 80.0000 mg | CHEWABLE_TABLET | Freq: Four times a day (QID) | ORAL | Status: DC | PRN

## 2022-03-06 MED ORDER — ROCURONIUM BROMIDE 10 MG/ML (PF) SYRINGE
PREFILLED_SYRINGE | INTRAVENOUS | Status: AC
  Filled 2022-03-06: qty 10

## 2022-03-06 MED ORDER — KETOROLAC TROMETHAMINE 30 MG/ML IJ SOLN: 30.0000 mg | Freq: Once | INTRAMUSCULAR | Status: DC | PRN

## 2022-03-06 MED ORDER — MIDAZOLAM HCL 2 MG/2ML IJ SOLN
INTRAMUSCULAR | Status: AC
  Filled 2022-03-06: qty 2

## 2022-03-06 MED ORDER — MIDAZOLAM HCL 5 MG/5ML IJ SOLN
INTRAMUSCULAR | Status: DC | PRN
  Administered 2022-03-06: 2 mg via INTRAVENOUS

## 2022-03-06 MED ORDER — PROPOFOL 10 MG/ML IV BOLUS
INTRAVENOUS | Status: AC
  Filled 2022-03-06: qty 20

## 2022-03-06 MED ORDER — ONDANSETRON HCL 4 MG/2ML IJ SOLN
INTRAMUSCULAR | Status: AC
  Filled 2022-03-06: qty 2

## 2022-03-06 MED ORDER — ONDANSETRON HCL 4 MG/2ML IJ SOLN: 4.0000 mg | Freq: Four times a day (QID) | INTRAMUSCULAR | Status: DC | PRN

## 2022-03-06 MED ORDER — CHLORHEXIDINE GLUCONATE 0.12 % MT SOLN
15.0000 mL | Freq: Once | OROMUCOSAL | Status: AC
  Administered 2022-03-06: 15 mL via OROMUCOSAL

## 2022-03-06 MED ORDER — POTASSIUM CHLORIDE IN NACL 20-0.9 MEQ/L-% IV SOLN
INTRAVENOUS | Status: DC
  Filled 2022-03-06 (×2): qty 1000

## 2022-03-06 MED ORDER — ONDANSETRON HCL 4 MG/2ML IJ SOLN: 4.0000 mg | Freq: Once | INTRAMUSCULAR | Status: DC | PRN

## 2022-03-06 MED ORDER — ACETAMINOPHEN 325 MG PO TABS
650.0000 mg | ORAL_TABLET | Freq: Four times a day (QID) | ORAL | Status: DC
  Administered 2022-03-06 – 2022-03-07 (×3): 650 mg via ORAL
  Filled 2022-03-06 (×4): qty 2

## 2022-03-06 MED ORDER — DEXAMETHASONE SODIUM PHOSPHATE 10 MG/ML IJ SOLN
INTRAMUSCULAR | Status: AC
  Filled 2022-03-06: qty 1

## 2022-03-06 MED ORDER — CEFAZOLIN SODIUM-DEXTROSE 2-4 GM/100ML-% IV SOLN
2.0000 g | Freq: Three times a day (TID) | INTRAVENOUS | Status: DC
  Administered 2022-03-06 – 2022-03-07 (×3): 2 g via INTRAVENOUS
  Filled 2022-03-06 (×5): qty 100

## 2022-03-06 MED ORDER — SUGAMMADEX SODIUM 200 MG/2ML IV SOLN
INTRAVENOUS | Status: DC | PRN
  Administered 2022-03-06: 200 mg via INTRAVENOUS

## 2022-03-06 MED ORDER — DEXAMETHASONE SODIUM PHOSPHATE 4 MG/ML IJ SOLN
INTRAMUSCULAR | Status: DC | PRN
  Administered 2022-03-06: 5 mg via INTRAVENOUS

## 2022-03-06 MED ORDER — ONDANSETRON HCL 4 MG/2ML IJ SOLN
INTRAMUSCULAR | Status: DC | PRN
  Administered 2022-03-06: 4 mg via INTRAVENOUS

## 2022-03-06 MED ORDER — 0.9 % SODIUM CHLORIDE (POUR BTL) OPTIME
TOPICAL | Status: DC | PRN
  Administered 2022-03-06: 1000 mL

## 2022-03-06 MED ORDER — OXYCODONE HCL 5 MG PO TABS: 5.0000 mg | ORAL_TABLET | Freq: Once | ORAL | Status: DC | PRN

## 2022-03-06 MED ORDER — LIDOCAINE HCL (PF) 2 % IJ SOLN
INTRAMUSCULAR | Status: AC
  Filled 2022-03-06: qty 5

## 2022-03-06 MED ORDER — PROPOFOL 10 MG/ML IV BOLUS
INTRAVENOUS | Status: DC | PRN
  Administered 2022-03-06: 150 mg via INTRAVENOUS

## 2022-03-06 MED ORDER — OXYCODONE HCL 5 MG PO TABS: 10.0000 mg | ORAL_TABLET | ORAL | Status: DC | PRN

## 2022-03-06 MED ORDER — METHOCARBAMOL 1000 MG/10ML IJ SOLN: 500.0000 mg | Freq: Four times a day (QID) | INTRAVENOUS | Status: DC | PRN

## 2022-03-06 SURGICAL SUPPLY — 45 items
ADH SKN CLS APL DERMABOND .7 (GAUZE/BANDAGES/DRESSINGS) ×1
APL PRP STRL LF DISP 70% ISPRP (MISCELLANEOUS) ×1
APPLIER CLIP ROT 10 11.4 M/L (STAPLE) ×1
APR CLP MED LRG 11.4X10 (STAPLE) ×1
BAG COUNTER SPONGE SURGICOUNT (BAG) IMPLANT
BAG SPEC RTRVL 10 TROC 200 (ENDOMECHANICALS) ×1
BAG SPNG CNTER NS LX DISP (BAG)
CABLE HIGH FREQUENCY MONO STRZ (ELECTRODE) ×1 IMPLANT
CATH URETL OPEN 5X70 (CATHETERS) IMPLANT
CHLORAPREP W/TINT 26 (MISCELLANEOUS) ×1 IMPLANT
CLIP APPLIE ROT 10 11.4 M/L (STAPLE) ×1 IMPLANT
COVER MAYO STAND XLG (MISCELLANEOUS) ×1 IMPLANT
COVER SURGICAL LIGHT HANDLE (MISCELLANEOUS) ×1 IMPLANT
DERMABOND ADVANCED .7 DNX12 (GAUZE/BANDAGES/DRESSINGS) ×1 IMPLANT
DRAPE C-ARM 42X120 X-RAY (DRAPES) IMPLANT
ELECT REM PT RETURN 15FT ADLT (MISCELLANEOUS) ×1 IMPLANT
ENDOLOOP SUT PDS II  0 18 (SUTURE) ×1
ENDOLOOP SUT PDS II 0 18 (SUTURE) ×1 IMPLANT
GLOVE BIO SURGEON STRL SZ7.5 (GLOVE) ×1 IMPLANT
GLOVE BIOGEL PI IND STRL 8 (GLOVE) ×1 IMPLANT
GOWN STRL REUS W/ TWL XL LVL3 (GOWN DISPOSABLE) ×2 IMPLANT
GOWN STRL REUS W/TWL XL LVL3 (GOWN DISPOSABLE) ×2
GRASPER SUT TROCAR 14GX15 (MISCELLANEOUS) IMPLANT
HEMOSTAT SNOW SURGICEL 2X4 (HEMOSTASIS) IMPLANT
IRRIG SUCT STRYKERFLOW 2 WTIP (MISCELLANEOUS) ×1
IRRIGATION SUCT STRKRFLW 2 WTP (MISCELLANEOUS) ×1 IMPLANT
IV CATH 14GX2 1/4 (CATHETERS) ×1 IMPLANT
KIT BASIN OR (CUSTOM PROCEDURE TRAY) ×1 IMPLANT
KIT TURNOVER KIT A (KITS) IMPLANT
NDL INSUFFLATION 14GA 120MM (NEEDLE) ×1 IMPLANT
NEEDLE INSUFFLATION 14GA 120MM (NEEDLE) ×1 IMPLANT
PENCIL SMOKE EVACUATOR (MISCELLANEOUS) IMPLANT
POUCH RETRIEVAL ECOSAC 10 (ENDOMECHANICALS) ×1 IMPLANT
POUCH RETRIEVAL ECOSAC 10MM (ENDOMECHANICALS) ×1
SCISSORS LAP 5X35 DISP (ENDOMECHANICALS) ×1 IMPLANT
SET TUBE SMOKE EVAC HIGH FLOW (TUBING) ×1 IMPLANT
SLEEVE Z-THREAD 5X100MM (TROCAR) ×2 IMPLANT
SPIKE FLUID TRANSFER (MISCELLANEOUS) ×1 IMPLANT
STOPCOCK 4 WAY LG BORE MALE ST (IV SETS) IMPLANT
SUT MNCRL AB 4-0 PS2 18 (SUTURE) ×1 IMPLANT
TOWEL OR 17X26 10 PK STRL BLUE (TOWEL DISPOSABLE) ×1 IMPLANT
TOWEL OR NON WOVEN STRL DISP B (DISPOSABLE) IMPLANT
TRAY LAPAROSCOPIC (CUSTOM PROCEDURE TRAY) ×1 IMPLANT
TROCAR ADV FIXATION 12X100MM (TROCAR) ×1 IMPLANT
TROCAR Z-THREAD OPTICAL 5X100M (TROCAR) ×1 IMPLANT

## 2022-03-06 NOTE — Anesthesia Preprocedure Evaluation (Signed)
Anesthesia Evaluation  Patient identified by MRN, date of birth, ID band Patient awake  General Assessment Comment:Gallstone pancreatitis  Reviewed: Allergy & Precautions, H&P , NPO status , Patient's Chart, lab work & pertinent test results  History of Anesthesia Complications (+) PONV and history of anesthetic complications  Airway Mallampati: II  TM Distance: >3 FB Neck ROM: Full    Dental no notable dental hx.    Pulmonary neg pulmonary ROS, former smoker   Pulmonary exam normal breath sounds clear to auscultation       Cardiovascular hypertension, Pt. on medications Normal cardiovascular exam Rhythm:Regular Rate:Normal     Neuro/Psych negative neurological ROS  negative psych ROS   GI/Hepatic Neg liver ROS,GERD  Medicated,,  Endo/Other  negative endocrine ROS    Renal/GU negative Renal ROS  negative genitourinary   Musculoskeletal negative musculoskeletal ROS (+)    Abdominal   Peds negative pediatric ROS (+)  Hematology  (+) Blood dyscrasia, anemia   Anesthesia Other Findings   Reproductive/Obstetrics negative OB ROS                             Anesthesia Physical Anesthesia Plan  ASA: 2  Anesthesia Plan: General   Post-op Pain Management: Tylenol PO (pre-op)*   Induction: Intravenous  PONV Risk Score and Plan: 4 or greater and Ondansetron, Dexamethasone, Scopolamine patch - Pre-op and Treatment may vary due to age or medical condition  Airway Management Planned: Oral ETT  Additional Equipment:   Intra-op Plan:   Post-operative Plan: Extubation in OR  Informed Consent: I have reviewed the patients History and Physical, chart, labs and discussed the procedure including the risks, benefits and alternatives for the proposed anesthesia with the patient or authorized representative who has indicated his/her understanding and acceptance.     Dental advisory  given  Plan Discussed with: CRNA and Surgeon  Anesthesia Plan Comments:        Anesthesia Quick Evaluation

## 2022-03-06 NOTE — Anesthesia Procedure Notes (Signed)
Procedure Name: Intubation Date/Time: 03/06/2022 12:00 PM  Performed by: Claudia Desanctis, CRNAPre-anesthesia Checklist: Patient identified, Emergency Drugs available, Suction available and Patient being monitored Patient Re-evaluated:Patient Re-evaluated prior to induction Oxygen Delivery Method: Circle system utilized Preoxygenation: Pre-oxygenation with 100% oxygen Induction Type: IV induction Ventilation: Mask ventilation without difficulty Laryngoscope Size: 2 and Miller Grade View: Grade I Tube type: Oral Tube size: 7.0 mm Number of attempts: 1 Airway Equipment and Method: Stylet Placement Confirmation: ETT inserted through vocal cords under direct vision, positive ETCO2 and breath sounds checked- equal and bilateral Secured at: 21 cm Tube secured with: Tape Dental Injury: Teeth and Oropharynx as per pre-operative assessment

## 2022-03-06 NOTE — Discharge Instructions (Signed)
 CHOLECYSTECTOMY POST OPERATIVE INSTRUCTIONS  Thinking Clearly  The anesthesia may cause you to feel different for 1 or 2 days. Do not drive, drink alcohol, or make any big decisions for at least 2 days.  Nutrition When you wake up, you will be able to drink small amounts of liquid. If you do not feel sick, you can slowly advance your diet to regular foods. Continue to drink lots of fluids, usually about 8 to 10 glasses per day. Eat a high-fiber diet so you don't strain during bowel movements. High-Fiber Foods Foods high in fiber include beans, bran cereals and whole-grain breads, peas, dried fruit (figs, apricots, and dates), raspberries, blackberries, strawberries, sweet corn, broccoli, baked potatoes with skin, plums, pears, apples, greens, and nuts. Activity Slowly increase your activity. Be sure to get up and walk every hour or so to prevent blood clots. No heavy lifting or strenuous activity for 4 weeks following surgery to prevent hernias at your incision sites It is normal to feel tired. You may need more sleep than usual.  Get your rest but make sure to get up and move around frequently to prevent blood clots and pneumonia.  Work and Return to School You can go back to work when you feel well enough. Discuss the timing with your surgeon. You can usually go back to school or work 1 week after an operation. If your work requires heavy lifting or strenuous activity you need to be placed on light duty for 4 weeks following surgery. You can return to gym class, sports or other physical activities 4 weeks after surgery.  Wound Care Always wash your hands before and after touching near your incision site. Do not soak in a bathtub until cleared at your follow up appointment. You may take a shower 24 hours after surgery. A small amount of drainage from the incision is normal. If the drainage is thick and yellow or the site is red, you may have an infection, so call your surgeon. If you  have a drain in one of your incisions, it will be taken out in office when the drainage stops. Steri-Strips will fall off in 7 to 10 days or they will be removed during your first office visit. If you have dermabond glue covering over the incision, allow the glue to flake off on its own. Avoid wearing tight or rough clothing. It may rub your incisions and make it harder for them to heal. Protect the new skin, especially from the sun. The sun can burn and cause darker scarring. Your scar will heal in about 4 to 6 weeks and will become softer and continue to fade over the next year.  The cosmetic appearance of the incisions will improve over the course of the first year after surgery. Sensation around your incision will return in a few weeks or months.  Bowel Movements After intestinal surgery, you may have loose watery stools for several days. If watery diarrhea lasts longer than 3 days, contact your surgeon. Pain medication (narcotics) can cause constipation. Increase the fiber in your diet with high-fiber foods if you are constipated. You can take an over the counter stool softener like Colace to avoid constipation.  Additional over the counter medications can also be used if Colace isn't sufficient (for example, Milk of Magnesia or Miralax).  Pain The amount of pain is different for each person. Some people need only 1 to 3 doses of pain control medication, while others need more. Take alternating doses of tylenol   and ibuprofen around the clock for the first five days following surgery.  This will provide a baseline of pain control and help with inflammation.  Take the narcotic pain medication in addition if needed for severe pain.  Contact Your Surgeon at [phone], if you have: Pain in your right upper abdomen like a gallbladder attack. Pain that will not go away Pain that gets worse A fever of more than 101F (38.3C) Repeated vomiting Swelling, redness, bleeding, or bad-smelling  drainage from your wound site Strong abdominal pain No bowel movement or unable to pass gas for 3 days Watery diarrhea lasting longer than 3 days  Pain Control The goal of pain control is to minimize pain, keep you moving and help you heal. Your surgical team will work with you on your pain plan. Most often a combination of therapies and medications are used to control your pain. You may also be given medication (local anesthetic) at the surgical site. This may help control your pain for several days. Extreme pain puts extra stress on your body at a time when your body needs to focus on healing. Do not wait until your pain has reached a level "10" or is unbearable before telling your doctor or nurse. It is much easier to control pain before it becomes severe. Following a laparoscopic procedure, pain is sometimes felt in the shoulder. This is due to the gas inserted into your abdomen during the procedure. Moving and walking helps to decrease the gas and the right shoulder pain.  Use the guide below for ways to manage your post-operative pain. Learn more by going to facs.org/safepaincontrol.  How Intense Is My Pain Common Therapies to Feel Better       I hardly notice my pain, and it does not interfere with my activities.  I notice my pain and it distracts me, but I can still do activities (sitting up, walking, standing).  Non-Medication Therapies  Ice (in a bag, applied over clothing at the surgical site), elevation, rest, meditation, massage, distraction (music, TV, play) walking and mild exercise Splinting the abdomen with pillows +  Non-Opioid Medications Acetaminophen (Tylenol) Non-steroidal anti-inflammatory drugs (NSAIDS) Aspirin, Ibuprofen (Motrin, Advil) Naproxen (Aleve) Take these as needed, when you feel pain. Both acetaminophen and NSAIDs help to decrease pain and swelling (inflammation).      My pain is hard to ignore and is more noticeable even when I rest.  My  pain interferes with my usual activities.  Non-Medication Therapies  +  Non-Opioid medications  Take on a regular schedule (around-the-clock) instead of as needed. (For example, Tylenol every 6 hours at 9:00 am, 3:00 pm, 9:00 pm, 3:00 am and Motrin every 6 hours at 12:00 am, 6:00 am, 12:00 pm, 6:00 pm)         I am focused on my pain, and I am not doing my daily activities.  I am groaning in pain, and I cannot sleep. I am unable to do anything.  My pain is as bad as it could be, and nothing else matters.  Non-Medication Therapies  +  Around-the-Clock Non-Opioid Medications  +  Short-acting opioids  Opioids should be used with other medications to manage severe pain. Opioids block pain and give a feeling of euphoria (feel high). Addiction, a serious side effect of opioids, is rare with short-term (a few days) use.  Examples of short-acting opioids include: Tramadol (Ultram), Hydrocodone (Norco, Vicodin), Hydromorphone (Dilaudid), Oxycodone (Oxycontin)     The above directions have been adapted from   the [employer] Surgical Patient Education Program.  Please refer to the ACS website if needed: https://www.facs.org/-/media/files/education/patient-ed/[email or web address].   Dustin Bumbaugh, MD [facility] [address], [address], [place], [place]  [address] ?  [address] [address], [place], [place]   [address] [phone] ? [phone] ? FAX [phone] Web site: [email or web address]  

## 2022-03-06 NOTE — Progress Notes (Addendum)
Patient ID: Casey Larsen, female   DOB: 07-Dec-1955, 67 y.o.   MRN: [id number]    Progress Note   Subjective   Day # 2 CC; gallstone pancreatitis  Abdominal ultrasound-large 5 cm shadowing gallstone in the gallbladder fundus no gallbladder wall thickening no pericholecystic fluid CBD 7.7 mm, hepatic steatosis - MRI/MRCP-gallstone in the gallbladder measures 2.9 cm no gallbladder wall thickening CBD 6 mm no signs of choledocholithiasis-no main pancreatic ductal dilation or mass identified mild edema involving the pancreas compatible with acute pancreatitis no signs of pancreatic necrosis or pseudocyst formation, trace amount of tracking along the left retroperitoneum  Lipase 2379> 105 today Potassium 3.6 T. bili 1.0/alk phos 79/AST 61/ALT 73 WBC 6.6/hemoglobin 10.3/hematocrit 32.7  Patient says she is feeling well, no significant abdominal pain, no nausea.  Surgery had just been in to see her and plans for lap chole later today   Objective   Vital signs in last 24 hours: Temp:  [97.8 F (36.6 C)-98.7 F (37.1 C)] 98.5 F (36.9 C) (02/13 0535) Pulse Rate:  [69-86] 69 (02/13 0535) Resp:  [14-18] 18 (02/13 0535) BP: (131-173)/(75-90) 134/79 (02/13 0535) SpO2:  [94 %-98 %] 98 % (02/13 0535) Weight:  ST:[id number] kg] 107 kg (02/12 1700)   General: Older white female in NAD Heart:  Regular rate and rhythm; no murmurs Lungs: Respirations even and unlabored, lungs CTA bilaterally Abdomen:  Soft, no significant tenderness, nondistended. Normal bowel sounds. Extremities:  Without edema. Neurologic:  Alert and oriented,  grossly normal neurologically. Psych:  Cooperative. Normal mood and affect.  Intake/Output from previous day: 02/12 0701 - 02/13 0700 In: 3146.3 [P.O.:480; I.V.:2666.3] Out: 1450 [Urine:1450] Intake/Output this shift: No intake/output data recorded.  Lab Results: Recent Labs    03/05/22 0314 03/06/22 0442  WBC 9.2 6.6  HGB 12.1 10.3*  HCT 37.9 32.7*  PLT 281 210    BMET Recent Labs    03/05/22 0314 03/06/22 0442  NA 141 141  K 3.1* 3.6  CL 105 107  CO2 24 25  GLUCOSE 118* 98  BUN 30* 20  CREATININE 0.76 0.70  CALCIUM 9.0 8.1*   LFT Recent Labs    03/06/22 0442  PROT 5.7*  ALBUMIN 3.2*  AST 61*  ALT 73*  ALKPHOS 79  BILITOT 1.0        Assessment / Plan:    #74 67 year old female admitted with acute abdominal pain radiating through to her back which lasted for several hours, associated with nausea but no vomiting.  Labs and imaging on admit most consistent with acute gallstone pancreatitis, no parameters concerning for severe pancreatitis.  MRI/MRCP yesterday shows no evidence of choledocholithiasis but she does have a large stone in the gallbladder, mild edema involving the pancreas.  Lipase almost normalized today, T. bili and alk phos remain normal  Suspect she may have passed a small stone or sludge  Plan; surgery plans for laparoscopic cholecystectomy today with IOC. We will follow-up IOC Repeat LFTs in a.m.     LOS: 1 day   Amy Esterwood PA-C 03/06/2022, 8:50 AM    [facility] Attending   Patient was/is much better. S/P successful lap chole.  Advance diet as tolerated per surgery.  F/U GI prn  Signing off.  [name], MD, [facility] See Shea Evans on call - gastroenterology for best contact person 03/06/2022 2:47 PM

## 2022-03-06 NOTE — TOC CM/SW Note (Signed)
Transition of Care [facility]) Screening Note  Patient Details  Name: Casey Larsen Date of Birth: 09/09/1955  Transition of Care [facility]) CM/SW Contact:    Sherie Don, LCSW Phone Number: 03/06/2022, 12:22 PM  Transition of Care Department [facility]) has reviewed patient and no TOC needs have been identified at this time. We will continue to monitor patient advancement through interdisciplinary progression rounds. If new patient transition needs arise, please place a TOC consult.

## 2022-03-06 NOTE — Transfer of Care (Signed)
Immediate Anesthesia Transfer of Care Note  Patient: Casey Larsen  Procedure(s) Performed: LAPAROSCOPIC CHOLECYSTECTOMY (Abdomen)  Patient Location: PACU  Anesthesia Type:General  Level of Consciousness: awake, alert , oriented, and patient cooperative  Airway & Oxygen Therapy: Patient Spontanous Breathing and Patient connected to face mask  Post-op Assessment: Report given to RN and Post -op Vital signs reviewed and stable  Post vital signs: Reviewed and stable  Last Vitals:  Vitals Value Taken Time  BP 166/87 03/06/22 1303  Temp    Pulse 74 03/06/22 1305  Resp 14 03/06/22 1305  SpO2 98 % 03/06/22 1305  Vitals shown include unvalidated device data.  Last Pain:  Vitals:   03/06/22 1031  TempSrc:   PainSc: 0-No pain         Complications: No notable events documented.

## 2022-03-06 NOTE — Progress Notes (Signed)
PROGRESS NOTE    Casey Larsen  [id number] DOB: [birth year] DOA: 03/05/2022 PCP: Burnard Bunting, MD   Brief Narrative:  The patient is a obese female with a past medical history significant for but not limited to osteoarthritis of the knees, asthmatic bronchitis, depression, female dermatophytosis, diverticulosis, GERD, hypertension, hyperlipidemia, history of melanoma of the face, history of pneumonia, pulmonary nodules as well as UTI and other comorbidities who presented to the ED with complaints of intermittent abdominal pain for last few weeks which was significantly worse yesterday evening after she ate "junk food" patient during the [employer].  Because of her symptoms she presented to the emergency room for further evaluation and urinalysis showed small hemoglobinuria and rare bacteria.  She did have elevated lipase level 2379.  Imaging done and showed a right upper quadrant ultrasound which showed cholelithiasis without evidence of Coley cystitis and the primary, bile duct of 7.7 mm.  GI and general surgery were consulted and MRCP was ordered and showed no evidence of choledocholithiasis.  Patient appears to have passed her gallstone surgery is now evaluating taking her for laparoscopic cholecystectomy.  Assessment and Plan: No notes have been filed under this hospital service. Service: Hospitalist  Acute Gallstone Pancreatitis -Placed in Inpatient/MedSurg. -ReSound done and showed a large 5 cm shadowing gallstone in the gallbladder fundus with no gallbladder wall thickening or pericholecystic colored fluid the common bile duct measuring 7.7 mm with associated hepatic steatosis -Continuing IV fluids but have reduce the rate of the normal saline +20 mEQ of KCl at 125 mill per hour to 75 MLS per hour for another 24 hours. -N.p.o. after midnight for laparoscopic cholecystectomy. -C/w Analgesics as needed with p.o. oxycodone IR, hydromorphone and acetaminophen -C/w Antiemetics as  needed. -Pantoprazole 40 mg IVP daily changed to p.o. twice daily. Recent Labs  Lab 03/05/22 0314 03/06/22 0442  LIPASE 2,379* 105*  -Obtain MRCP to visualize CBD/biliary tree and showed "Imaging findings compatible with acute pancreatitis. No signs of pancreatic necrosis or pseudocyst formation. Gallstones. No signs of choledocholithiasis or biliary dilatation. Hepatic steatosis. Trace bilateral pleural fluid and mild interstitial edema noted within the lung bases." -Gastroenterology consult appreciated but have now signed signed off the case -General surgery input appreciated and have taken the patient for a laparoscopic cholecystectomy today   GERD (gastroesophageal reflux disease)/GI Prophylaxis -Currently on Pantoprazole was changed to 40 mg p.o. twice daily   Asthmatic bronchitis, mild intermittent, without acute exacerbation -Asymptomatic at this time. -C/w Short acting beta agonist as needed. -Follow-up with PCP and/or pulmonology as an outpatient.   Diverticulosis -Seen by GI earlier. -High-fiber diet once cleared for regular oral intake. -Follow-up with Gastroenterology as an outpatient.   Hyperlipidemia -Currently not on medication -Lipid panel was checked and showed a total cholesterol/HDL ratio of 3.9, cholesterol level 176, HDL 45, LDL of 116, triglycerides of 76, VLDL 15. Follow-up with primary care provider.   Normocytic Anemia -? Dilutional Drop -Hgb/Hct Trend: Recent Labs  Lab 03/05/22 0314 03/06/22 0442  HGB 12.1 10.3*  HCT 37.9 32.7*  MCV 92.0 93.4  -Check anemia panel in a.m. -Continue to monitor for signs and symptoms of bleeding; no overt bleeding noted -Repeat CBC in the a.m.  Hypokalemia -Patient's K+ Level Trend: Recent Labs  Lab 03/05/22 0314 03/06/22 0442  K 3.1* 3.6  -Replete with IV Kcl 30 mEQ yesterday  -Continue to Monitor and Replete as Necessary -Repeat CMP in the AM   Abnormal LFTs -LFT, Bilirubin, and Alk Phos Trend: Recent  Labs  Lab 03/05/22 0314 03/06/22 0442  AST 87* 61*  ALT 37 73*  BILITOT 0.6 1.0  ALKPHOS 76 79  -In the setting of Gallstone Pancreatitis -Continue to Monitor LFT Trend and MRCP was done as above -GI Following -Repeat CMP in the AM  Hypoalbuminemia -Patient's Albumin Trend: Recent Labs  Lab 03/05/22 0314 03/06/22 0442  ALBUMIN 3.8 3.2*  -Continue to Monitor and Trend and repeat CMP in the AM   Obesity -Complicates overall prognosis and care -Estimated body mass index is 36.95 kg/m as calculated from the following:   Height as of this encounter: 5' 7"$  (1.702 m).   Weight as of this encounter: 107 kg.  -Weight Loss and Dietary Counseling given   DVT prophylaxis: SCDs Start: 03/05/22 0717    Code Status: Full Code Family Communication: No family Currently at bedside  Disposition Plan:  Level of care: Med-Surg Status is: Inpatient Remains inpatient appropriate because: Needs further clearance by general surgery and GI and anticipating discharge in next 24 to 48 hours if she is improved   Consultants:  Gastroenterology General surgery  Procedures:  As delineated as above Laparoscopic cholecystectomy  ECHOCARDIOGRAM IMPRESSIONS    1. Left ventricular ejection fraction, by estimation, is 60 to 65%. The  left ventricle has normal function. The left ventricle has no regional  wall motion abnormalities. Left ventricular diastolic parameters are  indeterminate.   2. Right ventricular systolic function is normal. The right ventricular  size is normal. There is normal pulmonary artery systolic pressure.   3. Left atrial size was mildly dilated.   4. The mitral valve is normal in structure. Trivial mitral valve  regurgitation. No evidence of mitral stenosis.   5. The aortic valve is tricuspid. There is mild calcification of the  aortic valve. Aortic valve regurgitation is not visualized. No aortic  stenosis is present.   6. The inferior vena cava is dilated in size  with >50% respiratory  variability, suggesting right atrial pressure of 8 mmHg.   Comparison(s): No prior Echocardiogram.   Conclusion(s)/Recommendation(s): Normal biventricular function without  evidence of hemodynamically significant valvular heart disease.   FINDINGS   Left Ventricle: Left ventricular ejection fraction, by estimation, is 60  to 65%. The left ventricle has normal function. The left ventricle has no  regional wall motion abnormalities. The left ventricular internal cavity  size was normal in size. There is   no left ventricular hypertrophy. Left ventricular diastolic parameters  are indeterminate.   Right Ventricle: The right ventricular size is normal. No increase in  right ventricular wall thickness. Right ventricular systolic function is  normal. There is normal pulmonary artery systolic pressure. The tricuspid  regurgitant velocity is 2.52 m/s, and   with an assumed right atrial pressure of 8 mmHg, the estimated right  ventricular systolic pressure is [id number] mmHg.   Left Atrium: Left atrial size was mildly dilated.   Right Atrium: Right atrial size was normal in size.   Pericardium: There is no evidence of pericardial effusion.   Mitral Valve: The mitral valve is normal in structure. Trivial mitral  valve regurgitation. No evidence of mitral valve stenosis.   Tricuspid Valve: The tricuspid valve is normal in structure. Tricuspid  valve regurgitation is trivial. No evidence of tricuspid stenosis.   Aortic Valve: The aortic valve is tricuspid. There is mild calcification  of the aortic valve. Aortic valve regurgitation is not visualized. No  aortic stenosis is present.   Pulmonic Valve: The pulmonic valve was  grossly normal. Pulmonic valve  regurgitation is not visualized. No evidence of pulmonic stenosis.   Aorta: The aortic root, ascending aorta, aortic arch and descending aorta  are all structurally normal, with no evidence of dilitation or   obstruction.   Venous: The inferior vena cava is dilated in size with greater than 50%  respiratory variability, suggesting right atrial pressure of 8 mmHg.   IAS/Shunts: No atrial level shunt detected by color flow Doppler.    LEFT VENTRICLE  PLAX 2D  LVIDd:         4.20 cm      Diastology  LVIDs:         2.70 cm      LV e' medial:    6.31 cm/s  LV PW:         0.80 cm      LV E/e' medial:  19.3  LV IVS:        0.80 cm      LV e' lateral:   5.71 cm/s  LVOT diam:     1.80 cm      LV E/e' lateral: 21.4  LV SV:         65  LV SV Index:   30  LVOT Area:     2.54 cm    LV Volumes (MOD)  LV vol d, MOD A2C: 128.0 ml  LV vol d, MOD A4C: 127.0 ml  LV vol s, MOD A2C: 51.0 ml  LV vol s, MOD A4C: 44.2 ml  LV SV MOD A2C:     77.0 ml  LV SV MOD A4C:     127.0 ml  LV SV MOD BP:      79.6 ml   RIGHT VENTRICLE             IVC  RV Basal diam:  3.40 cm     IVC diam: 2.20 cm  RV S prime:     17.20 cm/s  TAPSE (M-mode): 2.2 cm   LEFT ATRIUM             Index        RIGHT ATRIUM           Index  LA diam:        4.10 cm 1.89 cm/m   RA Area:     16.40 cm  LA Vol (A2C):   64.3 ml 29.64 ml/m  RA Volume:   35.80 ml  16.50 ml/m  LA Vol (A4C):   62.6 ml 28.85 ml/m  LA Biplane Vol: 68.7 ml 31.66 ml/m   AORTIC VALVE  LVOT Vmax:   105.00 cm/s  LVOT Vmean:  69.100 cm/s  LVOT VTI:    0.254 m    AORTA  Ao Root diam: 3.10 cm  Ao Asc diam:  3.10 cm   MITRAL VALVE                TRICUSPID VALVE  MV Area (PHT): 3.21 cm     TR Peak grad:   25.4 mmHg  MV Decel Time: 236 msec     TR Vmax:        252.00 cm/s  MV E velocity: 122.00 cm/s  MV A velocity: 167.00 cm/s  SHUNTS  MV E/A ratio:  0.73         Systemic VTI:  0.25 m  Systemic Diam: 1.80 cm   Antimicrobials:  Anti-infectives (From admission, onward)    None       Subjective: Seen and examined at bedside and states that she was hungry and awaiting surgical intervention.  No chest pain or shortness of  breath.  No other concerns or complaints this time.  Objective: Vitals:   03/05/22 1842 03/05/22 2045 03/06/22 0127 03/06/22 0535  BP: (!) 173/90 (!) 156/81 131/78 134/79  Pulse: 86 70 73 69  Resp: 16 16 18 18  $ Temp: 98.1 F (36.7 C) 98.6 F (37 C) 98.7 F (37.1 C) 98.5 F (36.9 C)  TempSrc: Oral Oral Oral Oral  SpO2: 94% 95% 96% 98%  Weight:      Height:        Intake/Output Summary (Last 24 hours) at 03/06/2022 [id number] Last data filed at 03/06/2022 [id number] Gross per 24 hour  Intake 3146.29 ml  Output 1450 ml  Net 1696.29 ml   Filed Weights   03/05/22 1700  Weight: 107 kg   Examination: Physical Exam:  Constitutional: WN/WD obese Caucasian female in NAD Respiratory: Diminished to auscultation bilaterally, no wheezing, rales, rhonchi or crackles. Normal respiratory effort and patient is not tachypenic. No accessory muscle use. Unlabored  Cardiovascular: RRR, no murmurs / rubs / gallops. S1 and S2 auscultated. No extremity edema.  Abdomen: Soft, not really tender, Distended 2/2 body habitus. Bowel sounds positive.  GU: Deferred. Musculoskeletal: No clubbing / cyanosis of digits/nails. No joint deformity upper and lower extremities.  Skin: No rashes, lesions, ulcers on a limited skin evaluation. No induration; Warm and dry.  Neurologic: CN 2-12 grossly intact with no focal deficits. Romberg sign and cerebellar reflexes not assessed.  Psychiatric: Normal judgment and insight. Alert and oriented x 3. Normal mood and appropriate affect.   Data Reviewed: I have personally reviewed following labs and imaging studies  CBC: Recent Labs  Lab 03/05/22 0314 03/06/22 0442  WBC 9.2 6.6  HGB 12.1 10.3*  HCT 37.9 32.7*  MCV 92.0 93.4  PLT 281 [id number]   Basic Metabolic Panel: Recent Labs  Lab 03/05/22 0314 03/05/22 0325 03/06/22 0442  NA 141  --  141  K 3.1*  --  3.6  CL 105  --  107  CO2 24  --  25  GLUCOSE 118*  --  98  BUN 30*  --  20  CREATININE 0.76  --  0.70  CALCIUM 9.0   --  8.1*  MG  --  1.7  --   PHOS  --  4.2  --    GFR: Estimated Creatinine Clearance: 87.1 mL/min (by C-G formula based on SCr of 0.7 mg/dL). Liver Function Tests: Recent Labs  Lab 03/05/22 0314 03/06/22 0442  AST 87* 61*  ALT 37 73*  ALKPHOS 76 79  BILITOT 0.6 1.0  PROT 6.7 5.7*  ALBUMIN 3.8 3.2*   Recent Labs  Lab 03/05/22 0314 03/06/22 0442  LIPASE 2,379* 105*   No results for input(s): "AMMONIA" in the last 168 hours. Coagulation Profile: No results for input(s): "INR", "PROTIME" in the last 168 hours. Cardiac Enzymes: No results for input(s): "CKTOTAL", "CKMB", "CKMBINDEX", "TROPONINI" in the last 168 hours. BNP (last 3 results) No results for input(s): "PROBNP" in the last 8760 hours. HbA1C: No results for input(s): "HGBA1C" in the last 72 hours. CBG: No results for input(s): "GLUCAP" in the last 168 hours. Lipid Profile: Recent Labs    03/05/22 0314  CHOL 176  HDL 45  LDLCALC 116*  TRIG 76  CHOLHDL 3.9   Thyroid Function Tests: No results for input(s): "TSH", "T4TOTAL", "FREET4", "T3FREE", "THYROIDAB" in the last 72 hours. Anemia Panel: No results for input(s): "VITAMINB12", "FOLATE", "FERRITIN", "TIBC", "IRON", "RETICCTPCT" in the last 72 hours. Sepsis Labs: No results for input(s): "PROCALCITON", "LATICACIDVEN" in the last 168 hours.  No results found for this or any previous visit (from the past 240 hour(s)).   Radiology Studies: MR ABDOMEN MRCP W WO CONTAST  Result Date: 03/06/2022 CLINICAL DATA:  Acute pancreatitis and cholelithiasis. EXAM: MRI ABDOMEN WITHOUT AND WITH CONTRAST (INCLUDING MRCP) TECHNIQUE: Multiplanar multisequence MR imaging of the abdomen was performed both before and after the administration of intravenous contrast. Heavily T2-weighted images of the biliary and pancreatic ducts were obtained, and three-dimensional MRCP images were rendered by post processing. CONTRAST:  35m GADAVIST GADOBUTROL 1 MMOL/ML IV SOLN COMPARISON:   Abdominal sonogram 03/05/2022 FINDINGS: Lower chest: Trace bilateral pleural fluid and mild interstitial edema noted within the lung bases. Hepatobiliary: Hepatic scratch set mild diffuse hepatic steatosis. No suspicious enhancing liver lesions. Stone within the gallbladder measures 2.9 cm. No significant gallbladder wall thickening or pericholecystic inflammation. The common bile duct measures 6 mm in diameter. No signs of choledocholithiasis. Pancreas: There is no main duct dilatation or mass identified. Mild edema involving the pancreas is identified compatible with acute pancreatitis. No significant scratch set there is mild Peri pancreatic fat stranding. No significant free fluid or focal fluid collections identified. No signs of pancreatic necrosis or pseudocyst formation. Spleen:  Within normal limits in size and appearance. Adrenals/Urinary Tract:  Normal adrenal glands. No nephrolithiasis or kidney mass identified. No hydronephrosis. Simple appearing cyst arises off the upper pole of the left kidney measuring 1.2 cm, image 22/5. Several too small to reliably characterize T2 hyperintense kidney lesions are noted bilaterally, which are likely benign cysts. No follow-up imaging recommended. Stomach/Bowel: Visualized portions within the abdomen are unremarkable. Vascular/Lymphatic: Aortic atherosclerosis. No abdominal adenopathy. Other: There is a trace amount of tracking along the left retroperitoneum. No discrete fluid collections. Musculoskeletal: No suspicious bone lesions identified. IMPRESSION: 1. Imaging findings compatible with acute pancreatitis. No signs of pancreatic necrosis or pseudocyst formation. 2. Gallstones. No signs of choledocholithiasis or biliary dilatation. 3. Hepatic steatosis. 4. Trace bilateral pleural fluid and mild interstitial edema noted within the lung bases. Electronically Signed   By: TKerby MoorsM.D.   On: 03/06/2022 05:11   MR 3D Recon At Scanner  Result Date:  03/06/2022 CLINICAL DATA:  Acute pancreatitis and cholelithiasis. EXAM: MRI ABDOMEN WITHOUT AND WITH CONTRAST (INCLUDING MRCP) TECHNIQUE: Multiplanar multisequence MR imaging of the abdomen was performed both before and after the administration of intravenous contrast. Heavily T2-weighted images of the biliary and pancreatic ducts were obtained, and three-dimensional MRCP images were rendered by post processing. CONTRAST:  121mGADAVIST GADOBUTROL 1 MMOL/ML IV SOLN COMPARISON:  Abdominal sonogram 03/05/2022 FINDINGS: Lower chest: Trace bilateral pleural fluid and mild interstitial edema noted within the lung bases. Hepatobiliary: Hepatic scratch set mild diffuse hepatic steatosis. No suspicious enhancing liver lesions. Stone within the gallbladder measures 2.9 cm. No significant gallbladder wall thickening or pericholecystic inflammation. The common bile duct measures 6 mm in diameter. No signs of choledocholithiasis. Pancreas: There is no main duct dilatation or mass identified. Mild edema involving the pancreas is identified compatible with acute pancreatitis. No significant scratch set there is mild Peri pancreatic fat stranding. No significant free fluid or focal fluid collections identified. No signs of pancreatic necrosis or pseudocyst formation.  Spleen:  Within normal limits in size and appearance. Adrenals/Urinary Tract:  Normal adrenal glands. No nephrolithiasis or kidney mass identified. No hydronephrosis. Simple appearing cyst arises off the upper pole of the left kidney measuring 1.2 cm, image 22/5. Several too small to reliably characterize T2 hyperintense kidney lesions are noted bilaterally, which are likely benign cysts. No follow-up imaging recommended. Stomach/Bowel: Visualized portions within the abdomen are unremarkable. Vascular/Lymphatic: Aortic atherosclerosis. No abdominal adenopathy. Other: There is a trace amount of tracking along the left retroperitoneum. No discrete fluid collections.  Musculoskeletal: No suspicious bone lesions identified. IMPRESSION: 1. Imaging findings compatible with acute pancreatitis. No signs of pancreatic necrosis or pseudocyst formation. 2. Gallstones. No signs of choledocholithiasis or biliary dilatation. 3. Hepatic steatosis. 4. Trace bilateral pleural fluid and mild interstitial edema noted within the lung bases. Electronically Signed   By: Kerby Moors M.D.   On: 03/06/2022 05:11   ECHOCARDIOGRAM COMPLETE  Result Date: 03/05/2022    ECHOCARDIOGRAM REPORT   [name] Date of Exam: 03/05/2022 Medical Rec #:  [id number]        Height:       67.0 in Accession #:    [id number]       Weight:       236.0 lb Date of Birth:  [birth year]         BSA:          2.170 m Patient Age:    67 years         BP:           156/75 mmHg Patient Gender: F                HR:           78 bpm. Exam Location:  Inpatient Procedure: 2D Echo, Cardiac Doppler and Color Doppler Indications:    R94.31 Abnormal EKG, Dyspnea  History:        Patient has no prior history of Echocardiogram examinations.                 Risk Factors:Dyslipidemia and Hypertension.  Sonographer:    Phineas Douglas Referring Phys: [id number] [place]  1. Left ventricular ejection fraction, by estimation, is 60 to 65%. The left ventricle has normal function. The left ventricle has no regional wall motion abnormalities. Left ventricular diastolic parameters are indeterminate.  2. Right ventricular systolic function is normal. The right ventricular size is normal. There is normal pulmonary artery systolic pressure.  3. Left atrial size was mildly dilated.  4. The mitral valve is normal in structure. Trivial mitral valve regurgitation. No evidence of mitral stenosis.  5. The aortic valve is tricuspid. There is mild calcification of the aortic valve. Aortic valve regurgitation is not visualized. No aortic stenosis is present.  6. The inferior vena cava is dilated in size with >50%  respiratory variability, suggesting right atrial pressure of 8 mmHg. Comparison(s): No prior Echocardiogram. Conclusion(s)/Recommendation(s): Normal biventricular function without evidence of hemodynamically significant valvular heart disease. FINDINGS  Left Ventricle: Left ventricular ejection fraction, by estimation, is 60 to 65%. The left ventricle has normal function. The left ventricle has no regional wall motion abnormalities. The left ventricular internal cavity size was normal in size. There is  no left ventricular hypertrophy. Left ventricular diastolic parameters are indeterminate. Right Ventricle: The right ventricular size is normal. No increase in right ventricular wall thickness. Right ventricular systolic function is normal. There is normal pulmonary artery  systolic pressure. The tricuspid regurgitant velocity is 2.52 m/s, and  with an assumed right atrial pressure of 8 mmHg, the estimated right ventricular systolic pressure is [id number] mmHg. Left Atrium: Left atrial size was mildly dilated. Right Atrium: Right atrial size was normal in size. Pericardium: There is no evidence of pericardial effusion. Mitral Valve: The mitral valve is normal in structure. Trivial mitral valve regurgitation. No evidence of mitral valve stenosis. Tricuspid Valve: The tricuspid valve is normal in structure. Tricuspid valve regurgitation is trivial. No evidence of tricuspid stenosis. Aortic Valve: The aortic valve is tricuspid. There is mild calcification of the aortic valve. Aortic valve regurgitation is not visualized. No aortic stenosis is present. Pulmonic Valve: The pulmonic valve was grossly normal. Pulmonic valve regurgitation is not visualized. No evidence of pulmonic stenosis. Aorta: The aortic root, ascending aorta, aortic arch and descending aorta are all structurally normal, with no evidence of dilitation or obstruction. Venous: The inferior vena cava is dilated in size with greater than 50% respiratory  variability, suggesting right atrial pressure of 8 mmHg. IAS/Shunts: No atrial level shunt detected by color flow Doppler.  LEFT VENTRICLE PLAX 2D LVIDd:         4.20 cm      Diastology LVIDs:         2.70 cm      LV e' medial:    6.31 cm/s LV PW:         0.80 cm      LV E/e' medial:  19.3 LV IVS:        0.80 cm      LV e' lateral:   5.71 cm/s LVOT diam:     1.80 cm      LV E/e' lateral: 21.4 LV SV:         65 LV SV Index:   30 LVOT Area:     2.54 cm  LV Volumes (MOD) LV vol d, MOD A2C: 128.0 ml LV vol d, MOD A4C: 127.0 ml LV vol s, MOD A2C: 51.0 ml LV vol s, MOD A4C: 44.2 ml LV SV MOD A2C:     77.0 ml LV SV MOD A4C:     127.0 ml LV SV MOD BP:      79.6 ml RIGHT VENTRICLE             IVC RV Basal diam:  3.40 cm     IVC diam: 2.20 cm RV S prime:     17.20 cm/s TAPSE (M-mode): 2.2 cm LEFT ATRIUM             Index        RIGHT ATRIUM           Index LA diam:        4.10 cm 1.89 cm/m   RA Area:     16.40 cm LA Vol (A2C):   64.3 ml 29.64 ml/m  RA Volume:   35.80 ml  16.50 ml/m LA Vol (A4C):   62.6 ml 28.85 ml/m LA Biplane Vol: 68.7 ml 31.66 ml/m  AORTIC VALVE LVOT Vmax:   105.00 cm/s LVOT Vmean:  69.100 cm/s LVOT VTI:    0.254 m  AORTA Ao Root diam: 3.10 cm Ao Asc diam:  3.10 cm MITRAL VALVE                TRICUSPID VALVE MV Area (PHT): 3.21 cm     TR Peak grad:   25.4 mmHg MV Decel Time: 236 msec  TR Vmax:        252.00 cm/s MV E velocity: 122.00 cm/s MV A velocity: 167.00 cm/s  SHUNTS MV E/A ratio:  0.73         Systemic VTI:  0.25 m                             Systemic Diam: 1.80 cm Buford Dresser MD Electronically signed by Buford Dresser MD Signature Date/Time: 03/05/2022/4:29:19 PM    Final    US Abdomen Limited RUQ (LIVER/GB)  Result Date: 03/05/2022 CLINICAL DATA:  [id number] with epigastric pain. EXAM: ULTRASOUND ABDOMEN LIMITED RIGHT UPPER QUADRANT COMPARISON:  None Available. FINDINGS: Gallbladder: There is a large 5 cm shadowing stone in the gallbladder fundus. The gallbladder free  wall thickness is normal at 1.9 mm and there is no positive sonographic Murphy's sign or pericholecystic fluid. Common bile duct: Diameter: Prominent measuring 7.7 mm. No intrahepatic bile duct dilatation is seen. Liver: No focal lesion identified. There is increased parenchymal echogenicity consistent with steatosis. Portal vein is patent on color Doppler imaging with normal direction of blood flow towards the liver. Other: None. IMPRESSION: 1. Cholelithiasis without evidence of acute cholecystitis. 2. Prominent common bile duct at 7.7 mm. No intrahepatic bile duct dilatation. 3. Hepatic steatosis. Electronically Signed   By: Telford Nab M.D.   On: 03/05/2022 05:06    Scheduled Meds:  lip balm   Topical BID   pantoprazole  40 mg Oral BID   polycarbophil  625 mg Oral BID   Continuous Infusions:  sodium chloride     0.9 % NaCl with KCl 20 mEq / L 125 mL/hr at 03/06/22 [id number]   lactated ringers     methocarbamol (ROBAXIN) IV     ondansetron (ZOFRAN) IV      LOS: 1 day   Raiford Noble, DO [facility] Available via Epic secure chat 7am-7pm After these hours, please refer to coverage provider listed on [email or web address] 03/06/2022, 7:13 AM

## 2022-03-06 NOTE — Op Note (Signed)
Patient: Casey Larsen (May 04, 1955, [id number])  Date of Surgery: 03/06/22  Preoperative Diagnosis: Gallstone pancreatitis   Postoperative Diagnosis: Gallstone pancreatitis   Surgical Procedure: LAPAROSCOPIC CHOLECYSTECTOMY: [id number] (CPT)   Operative Team Members:  Surgeon(s) and Role:    * Fleda Pagel, Nickola Major, MD - Primary   Anesthesiologist: Myrtie Soman, MD CRNA: Claudia Desanctis, CRNA   Anesthesia: General   Fluids:  Total I/O In: 500 [I.V.:400; IV Piggyback:100] Out: 10 [id number]  Complications: None  Drains:  none   Specimen:  ID Type Source Tests Collected by Time Destination  1 : gallbladder Tissue PATH Gallbladder SURGICAL PATHOLOGY Kenyotta Dorfman, Nickola Major, MD 03/06/2022 1235      Disposition:  PACU - hemodynamically stable.  Plan of Care:  continue inpatient care    Indications for Procedure: Casey Larsen is a 67 y.o. female who presented with abdominal pain.  History, physical and imaging was concerning for gallstone pancreatitis.  Laparoscopic cholecystectomy was recommended for the patient.  The procedure itself, as well as the risks, benefits and alternatives were discussed with the patient.  Risks discussed included but were not limited to the risk of infection, bleeding, damage to nearby structures, need to convert to open procedure, incisional hernia, bile leak, common bile duct injury and the need for additional procedures or surgeries.  With this discussion complete and all questions answered the patient granted consent to proceed.  Findings: Massive gallstone  Infection status: Patient: Casey Larsen Emergency General Surgery Service Patient Case: Urgent Infection Present At Time Of Surgery (PATOS):  Some spillage of bile during the procedure   Description of Procedure:   On the date stated above, the patient was taken to the operating room suite and placed in supine positioning.  Sequential compression devices were placed on the lower  extremities to prevent blood clots.  General endotracheal anesthesia was induced. Preoperative antibiotics were given.  The patient's abdomen was prepped and draped in the usual sterile fashion.  A time-out was completed verifying the correct patient, procedure, positioning and equipment needed for the case.  We began by anesthetizing the skin with local anesthetic and then making a 5 mm incision just below the umbilicus.  We dissected through the subcutaneous tissues to the fascia.  The fascia was grasped and elevated using a Kocher clamp.  A Veress needle was inserted into the abdomen and the abdomen was insufflated to 15 mmHg.  A 5 mm trocar was inserted in this position under optical guidance and then the abdomen was inspected.  There was no trauma to the underlying viscera with initial trocar placement.  Any abnormal findings, other than inflammation in the right upper quadrant, are listed above in the findings section.  Three additional trocars were placed, one 12 mm trocar in the subxiphoid position, one 5 mm trocar in the midline epigastric area and one 55m trocar in the right upper quadrant subcostally.  These were placed under direct vision without any trauma to the underlying viscera.    The patient was then placed in head up, left side down positioning.  The gallbladder was identified and dissected free from its attachments to the omentum allowing the duodenum to fall away.  The infundibulum of the gallbladder was dissected free working laterally to medially.  The cystic duct and cystic artery were dissected free from surrounding connective tissue.  The infundibulum of the gallbladder was dissected off the cystic plate.  A critical view of safety was obtained with the cystic duct and cystic artery  being cleared of connective tissues and clearly the only two structures entering into the gallbladder with the liver clearly visible behind.  Clips were then applied to the cystic duct and cystic artery and  then these structures were divided.  A PDS Endoloop was placed to secure the cystic duct stump.  The gallbladder was dissected off the cystic plate, placed in an endocatch bag and removed from the 12 mm subxiphoid port site.  There was some spillage of bile from a hole created in the body of the gallbladder.  The clips were inspected and appeared effective.  The cystic plate was inspected and hemostasis was obtained using electrocautery.    A suction irrigator was used to clean the operative field.  Attention was turned to closure.  The 12 mm subxiphoid port site was closed using a 0-vicryl suture on a fascial suture passer.  The abdomen was desufflated.  The skin was closed using 4-0 monocryl and dermabond.  All sponge and needle counts were correct at the conclusion of the case.    Louanna Raw, MD General, Bariatric, & Minimally Invasive Surgery [facility], [place]

## 2022-03-06 NOTE — Progress Notes (Signed)
Progress Note: General Surgery Service   Chief Complaint/Subjective: Pain improved  Objective: Vital signs in last 24 hours: Temp:  [97.8 F (36.6 C)-98.7 F (37.1 C)] 98.5 F (36.9 C) (02/13 0535) Pulse Rate:  [69-86] 69 (02/13 0535) Resp:  [14-18] 18 (02/13 0535) BP: (131-173)/(75-90) 134/79 (02/13 0535) SpO2:  [94 %-98 %] 98 % (02/13 0535) Weight:  [id number] kg] 107 kg (02/12 1700)    Intake/Output from previous day: 02/12 0701 - 02/13 0700 In: 3146.3 [P.O.:480; I.V.:2666.3] Out: 1450 [Urine:1450] Intake/Output this shift: No intake/output data recorded.  Constitutional: NAD; conversant; no deformities Eyes: Moist conjunctiva; no lid lag; anicteric; PERRL Neck: Trachea midline; no thyromegaly Lungs: Normal respiratory effort; no tactile fremitus CV: RRR; no palpable thrills; no pitting edema GI: Abd Soft, nontender; no palpable hepatosplenomegaly MSK: Normal range of motion of extremities; no clubbing/cyanosis Psychiatric: Appropriate affect; alert and oriented x3 Lymphatic: No palpable cervical or axillary lymphadenopathy  Lab Results: CBC  Recent Labs    03/05/22 0314 03/06/22 0442  WBC 9.2 6.6  HGB 12.1 10.3*  HCT 37.9 32.7*  PLT 281 210   BMET Recent Labs    03/05/22 0314 03/06/22 0442  NA 141 141  K 3.1* 3.6  CL 105 107  CO2 24 25  GLUCOSE 118* 98  BUN 30* 20  CREATININE 0.76 0.70  CALCIUM 9.0 8.1*   PT/INR No results for input(s): "LABPROT", "INR" in the last 72 hours. ABG No results for input(s): "PHART", "HCO3" in the last 72 hours.  Invalid input(s): "PCO2", "PO2"  Anti-infectives: Anti-infectives (From admission, onward)    None       Medications: Scheduled Meds:  lip balm   Topical BID   pantoprazole  40 mg Oral BID   polycarbophil  625 mg Oral BID   Continuous Infusions:  sodium chloride     0.9 % NaCl with KCl 20 mEq / L 125 mL/hr at 03/06/22 [id number]   lactated ringers     methocarbamol (ROBAXIN) IV     ondansetron (ZOFRAN)  IV     PRN Meds:.sodium chloride, acetaminophen, acetaminophen, alum & mag hydroxide-simeth, bisacodyl, diazepam, HYDROmorphone (DILAUDID) injection, lactated ringers, magic mouthwash, menthol-cetylpyridinium, methocarbamol (ROBAXIN) IV, methocarbamol, ondansetron (ZOFRAN) IV **OR** ondansetron (ZOFRAN) IV, oxyCODONE, phenol, prochlorperazine  Assessment/Plan: Gallstone pancreatitis  No choledocholithiasis on MRCP yesterday Pain improved today  I recommend laparoscopic cholecystectomy.  We discussed the procedure, its risks, benefits and alteratives and the patient granted consent to proceed. Will proceed to the OR today.     LOS: 1 day     Felicie Morn, [facility] Use [email or web address] to contact on call provider  Daily Billing: [phone] - High MDM

## 2022-03-07 ENCOUNTER — Encounter (HOSPITAL_COMMUNITY): Payer: Self-pay | Admitting: Surgery

## 2022-03-07 DIAGNOSIS — K851 Biliary acute pancreatitis without necrosis or infection: Secondary | ICD-10-CM | POA: Diagnosis not present

## 2022-03-07 DIAGNOSIS — K5790 Diverticulosis of intestine, part unspecified, without perforation or abscess without bleeding: Secondary | ICD-10-CM | POA: Diagnosis not present

## 2022-03-07 DIAGNOSIS — J4521 Mild intermittent asthma with (acute) exacerbation: Secondary | ICD-10-CM | POA: Diagnosis not present

## 2022-03-07 DIAGNOSIS — K219 Gastro-esophageal reflux disease without esophagitis: Secondary | ICD-10-CM | POA: Diagnosis not present

## 2022-03-07 LAB — CBC WITH DIFFERENTIAL/PLATELET
Abs Immature Granulocytes: 0.05 10*3/uL (ref 0.00–0.07)
Basophils Absolute: 0 10*3/uL (ref 0.0–0.1)
Basophils Relative: 0 %
Eosinophils Absolute: 0.1 10*3/uL (ref 0.0–0.5)
Eosinophils Relative: 2 %
HCT: 33.6 % — ABNORMAL LOW (ref 36.0–46.0)
Hemoglobin: 10.7 g/dL — ABNORMAL LOW (ref 12.0–15.0)
Immature Granulocytes: 1 %
Lymphocytes Relative: 13 %
Lymphs Abs: 1.2 10*3/uL (ref 0.7–4.0)
MCH: 29.6 pg (ref 26.0–34.0)
MCHC: 31.8 g/dL (ref 30.0–36.0)
MCV: 92.8 fL (ref 80.0–100.0)
Monocytes Absolute: 0.6 10*3/uL (ref 0.1–1.0)
Monocytes Relative: 6 %
Neutro Abs: 7.3 10*3/uL (ref 1.7–7.7)
Neutrophils Relative %: 78 %
Platelets: 236 10*3/uL (ref 150–400)
RBC: 3.62 MIL/uL — ABNORMAL LOW (ref 3.87–5.11)
RDW: 12.9 % (ref 11.5–15.5)
WBC: 9.2 10*3/uL (ref 4.0–10.5)
nRBC: 0 % (ref 0.0–0.2)

## 2022-03-07 LAB — COMPREHENSIVE METABOLIC PANEL
ALT: 62 U/L — ABNORMAL HIGH (ref 0–44)
AST: 48 U/L — ABNORMAL HIGH (ref 15–41)
Albumin: 3.3 g/dL — ABNORMAL LOW (ref 3.5–5.0)
Alkaline Phosphatase: 73 U/L (ref 38–126)
Anion gap: 8 (ref 5–15)
BUN: 20 mg/dL (ref 8–23)
CO2: 24 mmol/L (ref 22–32)
Calcium: 8.5 mg/dL — ABNORMAL LOW (ref 8.9–10.3)
Chloride: 107 mmol/L (ref 98–111)
Creatinine, Ser: 0.74 mg/dL (ref 0.44–1.00)
GFR, Estimated: >60 mL/min (ref >60)
Glucose, Bld: 110 mg/dL — ABNORMAL HIGH (ref 70–99)
Potassium: 4 mmol/L (ref 3.5–5.1)
Sodium: 139 mmol/L (ref 135–145)
Total Bilirubin: 0.6 mg/dL (ref 0.3–1.2)
Total Protein: 6.2 g/dL — ABNORMAL LOW (ref 6.5–8.1)

## 2022-03-07 LAB — PHOSPHORUS: Phosphorus: 3.7 mg/dL (ref 2.5–4.6)

## 2022-03-07 LAB — LIPASE, BLOOD: Lipase: 33 U/L (ref 11–51)

## 2022-03-07 LAB — MAGNESIUM: Magnesium: 1.7 mg/dL (ref 1.7–2.4)

## 2022-03-07 MED ORDER — HYDROCHLOROTHIAZIDE 12.5 MG PO TABS
12.5000 mg | ORAL_TABLET | Freq: Two times a day (BID) | ORAL | Status: DC
  Administered 2022-03-07: 12.5 mg via ORAL
  Filled 2022-03-07: qty 1

## 2022-03-07 MED ORDER — SENNA 8.6 MG PO TABS: 1.0000 | ORAL_TABLET | Freq: Every day | ORAL | 1 refills | Status: DC

## 2022-03-07 MED ORDER — LISINOPRIL 20 MG PO TABS
20.0000 mg | ORAL_TABLET | Freq: Two times a day (BID) | ORAL | Status: DC
  Administered 2022-03-07: 20 mg via ORAL
  Filled 2022-03-07: qty 1

## 2022-03-07 MED ORDER — LISINOPRIL-HYDROCHLOROTHIAZIDE 20-12.5 MG PO TABS: 1.0000 | ORAL_TABLET | Freq: Two times a day (BID) | ORAL | Status: DC

## 2022-03-07 MED ORDER — PANTOPRAZOLE SODIUM 40 MG PO TBEC: DELAYED_RELEASE_TABLET | ORAL | 1 refills | Status: DC

## 2022-03-07 MED ORDER — CALCIUM POLYCARBOPHIL 625 MG PO TABS: 625.0000 mg | ORAL_TABLET | Freq: Two times a day (BID) | ORAL | Status: DC

## 2022-03-07 MED ORDER — SODIUM CHLORIDE 0.9 % IV SOLN: INTRAVENOUS | Status: DC

## 2022-03-07 MED ORDER — OXYCODONE HCL 5 MG PO TABS: 5.0000 mg | ORAL_TABLET | ORAL | 0 refills | Status: DC | PRN

## 2022-03-07 MED ORDER — ACETAMINOPHEN 325 MG PO TABS: 650.0000 mg | ORAL_TABLET | Freq: Four times a day (QID) | ORAL | Status: AC | PRN

## 2022-03-07 MED ORDER — MAGNESIUM SULFATE 2 GM/50ML IV SOLN
2.0000 g | Freq: Once | INTRAVENOUS | Status: AC
  Administered 2022-03-07: 2 g via INTRAVENOUS
  Filled 2022-03-07: qty 50

## 2022-03-07 NOTE — Anesthesia Postprocedure Evaluation (Signed)
Anesthesia Post Note  Patient: Casey Larsen  Procedure(s) Performed: LAPAROSCOPIC CHOLECYSTECTOMY (Abdomen)     Patient location during evaluation: PACU Anesthesia Type: General Level of consciousness: awake and alert Pain management: pain level controlled Vital Signs Assessment: post-procedure vital signs reviewed and stable Respiratory status: spontaneous breathing, nonlabored ventilation, respiratory function stable and patient connected to nasal cannula oxygen Cardiovascular status: blood pressure returned to baseline and stable Postop Assessment: no apparent nausea or vomiting Anesthetic complications: no  No notable events documented.  Last Vitals:  Vitals:   03/07/22 0228 03/07/22 0617  BP: (!) 165/70 (!) 167/80  Pulse: 66 63  Resp: 18 18  Temp: 36.7 C 36.8 C  SpO2: 94% 97%    Last Pain:  Vitals:   03/07/22 0617  TempSrc: Oral  PainSc:                  Crystle Carelli S

## 2022-03-07 NOTE — Discharge Summary (Signed)
Physician Discharge Summary  Casey Larsen [id number] DOB: 07-Jun-1955 DOA: 03/05/2022  PCP: Burnard Bunting, MD  Admit date: 03/05/2022 Discharge date: 03/07/2022  Time spent: 60 minutes  Recommendations for Outpatient Follow-up:  Follow-up with Burnard Bunting, MD in 2 weeks.  On follow-up patient will need a comprehensive metabolic profile done to follow-up on electrolytes and renal function.  Patient will need a CBC done to follow-up on H&H. Follow-up with Dr. Henrene Pastor, gastroenterology in 4 weeks. Follow-up with Malachi Pro, PA on 03/27/2022, general surgery.   Discharge Diagnoses:  Principal Problem:   Gallstone pancreatitis Active Problems:   GERD (gastroesophageal reflux disease)   Asthmatic bronchitis, mild intermittent, with acute exacerbation   Diverticulosis   Hyperlipidemia   Discharge Condition: Stable and improved.  Diet recommendation: High-fiber diet.  Filed Weights   03/05/22 1700 03/06/22 1031  Weight: 107 kg 107 kg    History of present illness:  HPI per Dr. Anitra Lauth is a 67 y.o. female with medical history significant of osteoarthritis of the knees, asthmatic bronchitis, depression, nail dermatophytosis, diverticulosis, GERD, hyperlipidemia, hypertension, melanoma of the face, history of pneumonia, pulmonary nodules, UTI who presented to the emergency department with complaints of intermittent abdominal pain for the past 2 weeks that became significantly worse yesterday evening after she ate junk food during the [employer] Bowl.  She has been nauseous, but no emesis.   No  diarrhea, constipation, melena or hematochezia.No fever, chills or night sweats. No sore throat, rhinorrhea, dyspnea, wheezing or hemoptysis.  No chest pain, palpitations, diaphoresis, PND, orthopnea or pitting edema of the lower extremities.   No flank pain, dysuria, frequency or hematuria.  No polyuria, polydipsia, polyphagia or blurred vision.   Lab work: Urinalysis with small  hemoglobinuria and rare bacteria.  CBC was normal with a white count 9.2, hemoglobin 12.1 g/dL platelets 281.  Lipase 2379 units/L.  Lipid panel was normal except for an LDL cholesterol of 116 mg/dL.  CMP showed a potassium of 3.1 mmol/L, AST 87 units/L, glucose 118 and BUN of 30 mg/dL.  The rest of the CMP measurements were normal.   Imaging: RUQ ultrasound showed cholelithiasis without evidence of acute cholecystitis.  Prominent CBD at 7.7 mm.  No intrahepatic bile duct dilatation.  Hepatic steatosis.   ED course: Initial vital signs were temperature 97.5 F, pulse 67, respirations 16, BP 84/73 mmHg O2 sat 96% on room air.  The patient received LR 1000 mL bolus morphine 4 mg IVP x 1, ondansetron 4 mg IVP x 2.  Hospital Course:  #1 acute gallstone pancreatitis status post laparoscopic cholecystectomy 03/06/2022 -Patient was admitted with intermittent abdominal pain x 2 weeks worse with oral intake, noted on admission to have epigastric abdominal pain. -Abdominal ultrasound done without any evidence of acute cholecystitis however showed a prominent CBD at 7.7 mm with no intrahepatic bile duct dilatation, hepatic steatosis. -Lipase level on admission noted to be elevated at 2379. -Patient placed on clear liquids, hydrated aggressively with IV fluids, placed on IV PPI, MRCP ordered which was done showed findings compatible with acute pancreatitis, no signs of pancreatic necrosis or pseudocyst formation, gallstones, no signs of choledocholithiasis or biliary dilatation, hepatic steatosis, trace bilateral pleural fluid and mild interstitial edema noted at the lung bases. -GI consulted who followed the patient and as LFTs trended down significantly, with improvement with transaminitis and no evidence of choledocholithiasis GI felt patient may have passed a small stone or sludge and recommended evaluation by general surgery for laparoscopic  cholecystectomy with IOC. -Seen by general surgery, patient underwent  laparoscopic cholecystectomy on 03/06/2022 without any complications. -Patient improved clinically, diet advanced to a soft diet which patient tolerated. -Lipase levels trended down and her resolved to 33 by day of discharge, LFTs trended down and patient cleared by general surgery for discharge. -Patient will be discharged in stable improved condition with outpatient follow-up with general surgery and gastroenterology.  2.  GERD -Patient maintained on PPI twice daily during the hospitalization, will be discharged on PPI twice daily x 2 weeks and then daily.  3.  Mild intermittent asthmatic bronchitis, without acute exacerbation -Remained stable during the hospitalization. -Outpatient follow-up with PCP.  4.  Diverticulosis -Remained stable. -Patient be discharged on a high-fiber diet. -Outpatient follow-up with GI PCP.  5.  Hyperlipidemia -Outpatient follow-up with PCP.  6.  Normocytic anemia -Hemoglobin remained stable. -Outpatient follow-up with PCP.  7.  Transaminitis -Secondary to problem #1.  8.  Obesity -BMI of 36.95 kg/m. -Lifestyle modification. -Outpatient follow-up with PCP.  Procedures: MRCP 03/05/2022 Right upper quadrant ultrasound 03/05/2022 2D echo 03/05/2022 Laparoscopic cholecystectomy 03/06/2022 per Dr.Stechsculte  Consultations: Gastroenterology: Dr. Carlean Purl 03/05/2022 General surgery: Dr. Thermon Leyland 03/05/2022  Discharge Exam: Vitals:   03/07/22 0617 03/07/22 1318  BP: (!) 167/80 (!) 182/83  Pulse: 63 71  Resp: 18 16  Temp: 98.3 F (36.8 C)   SpO2: 97% 98%    General: NAD Cardiovascular: Regular rate rhythm no murmurs rubs or gallops.  No JVD.  No lower extremity edema. Respiratory: Clear to auscultation bilaterally.  No wheezes, no crackles, no rhonchi.  Fair air movement.  Speaking in full sentences.  Discharge Instructions   Discharge Instructions     Diet - low sodium heart healthy   Complete by: As directed    High fiber diet    Increase activity slowly   Complete by: As directed       Allergies as of 03/07/2022   No Known Allergies      Medication List     STOP taking these medications    ALEVE PM PO   omeprazole 20 MG capsule Commonly known as: PRILOSEC       TAKE these medications    acetaminophen 325 MG tablet Commonly known as: TYLENOL Take 2 tablets (650 mg total) by mouth every 6 (six) hours as needed.   GLUCOSAMINE-CHONDROITIN PO Take 1 tablet by mouth daily.   lisinopril-hydrochlorothiazide 20-12.5 MG tablet Commonly known as: ZESTORETIC Take 1 tablet by mouth in the morning and at bedtime.   Na Sulfate-K Sulfate-Mg Sulf 17.5-3.13-1.6 GM/177ML Soln Take 1 kit by mouth as directed. May use generic SUPREP;NO prior authorizations will be done.Please use Singlecare or GOOD-RX coupon.   oxyCODONE 5 MG immediate release tablet Commonly known as: Oxy IR/ROXICODONE Take 1 tablet (5 mg total) by mouth every 4 (four) hours as needed for moderate pain, severe pain or breakthrough pain.   pantoprazole 40 MG tablet Commonly known as: PROTONIX Take 1 tablet (40 mg total) by mouth 2 (two) times daily for 14 days, THEN 1 tablet (40 mg total) daily. Start taking on: March 07, 2022   polycarbophil 625 MG tablet Commonly known as: FIBERCON Take 1 tablet (625 mg total) by mouth 2 (two) times daily.   senna 8.6 MG Tabs tablet Commonly known as: SENOKOT Take 1 tablet (8.6 mg total) by mouth at bedtime.   Vitamin D 50 MCG (2000 UT) tablet Take 2,000 Units by mouth daily.       No  Known Allergies  Follow-up Information     Maczis, Carlena Hurl, PA-C Follow up on 03/27/2022.   Specialty: General Surgery Why: 9:15 am, Arrive 30 minutes prior to your appointment time, Please bring your insurance card and photo ID Contact information: [place] [address] [phone]         Burnard Bunting, MD. Schedule an appointment as soon  as possible for a visit in 2 week(s).   Specialty: Internal Medicine Contact information: [place] [address] [phone]         Irene Shipper, MD. Schedule an appointment as soon as possible for a visit in 4 week(s).   Specialty: Gastroenterology Contact information: [address] [place] [address] [phone]                  The results of significant diagnostics from this hospitalization (including imaging, microbiology, ancillary and laboratory) are listed below for reference.    Significant Diagnostic Studies: MR ABDOMEN MRCP W WO CONTAST  Result Date: 03/06/2022 CLINICAL DATA:  Acute pancreatitis and cholelithiasis. EXAM: MRI ABDOMEN WITHOUT AND WITH CONTRAST (INCLUDING MRCP) TECHNIQUE: Multiplanar multisequence MR imaging of the abdomen was performed both before and after the administration of intravenous contrast. Heavily T2-weighted images of the biliary and pancreatic ducts were obtained, and three-dimensional MRCP images were rendered by post processing. CONTRAST:  85m GADAVIST GADOBUTROL 1 MMOL/ML IV SOLN COMPARISON:  Abdominal sonogram 03/05/2022 FINDINGS: Lower chest: Trace bilateral pleural fluid and mild interstitial edema noted within the lung bases. Hepatobiliary: Hepatic scratch set mild diffuse hepatic steatosis. No suspicious enhancing liver lesions. Stone within the gallbladder measures 2.9 cm. No significant gallbladder wall thickening or pericholecystic inflammation. The common bile duct measures 6 mm in diameter. No signs of choledocholithiasis. Pancreas: There is no main duct dilatation or mass identified. Mild edema involving the pancreas is identified compatible with acute pancreatitis. No significant scratch set there is mild Peri pancreatic fat stranding. No significant free fluid or focal fluid collections identified. No signs of pancreatic necrosis or pseudocyst formation. Spleen:  Within normal limits in size and  appearance. Adrenals/Urinary Tract:  Normal adrenal glands. No nephrolithiasis or kidney mass identified. No hydronephrosis. Simple appearing cyst arises off the upper pole of the left kidney measuring 1.2 cm, image 22/5. Several too small to reliably characterize T2 hyperintense kidney lesions are noted bilaterally, which are likely benign cysts. No follow-up imaging recommended. Stomach/Bowel: Visualized portions within the abdomen are unremarkable. Vascular/Lymphatic: Aortic atherosclerosis. No abdominal adenopathy. Other: There is a trace amount of tracking along the left retroperitoneum. No discrete fluid collections. Musculoskeletal: No suspicious bone lesions identified. IMPRESSION: 1. Imaging findings compatible with acute pancreatitis. No signs of pancreatic necrosis or pseudocyst formation. 2. Gallstones. No signs of choledocholithiasis or biliary dilatation. 3. Hepatic steatosis. 4. Trace bilateral pleural fluid and mild interstitial edema noted within the lung bases. Electronically Signed   By: TKerby MoorsM.D.   On: 03/06/2022 05:11   MR 3D Recon At Scanner  Result Date: 03/06/2022 CLINICAL DATA:  Acute pancreatitis and cholelithiasis. EXAM: MRI ABDOMEN WITHOUT AND WITH CONTRAST (INCLUDING MRCP) TECHNIQUE: Multiplanar multisequence MR imaging of the abdomen was performed both before and after the administration of intravenous contrast. Heavily T2-weighted images of the biliary and pancreatic ducts were obtained, and three-dimensional MRCP images were rendered by post processing. CONTRAST:  152mGADAVIST GADOBUTROL 1 MMOL/ML IV SOLN COMPARISON:  Abdominal sonogram 03/05/2022 FINDINGS: Lower chest: Trace  bilateral pleural fluid and mild interstitial edema noted within the lung bases. Hepatobiliary: Hepatic scratch set mild diffuse hepatic steatosis. No suspicious enhancing liver lesions. Stone within the gallbladder measures 2.9 cm. No significant gallbladder wall thickening or pericholecystic  inflammation. The common bile duct measures 6 mm in diameter. No signs of choledocholithiasis. Pancreas: There is no main duct dilatation or mass identified. Mild edema involving the pancreas is identified compatible with acute pancreatitis. No significant scratch set there is mild Peri pancreatic fat stranding. No significant free fluid or focal fluid collections identified. No signs of pancreatic necrosis or pseudocyst formation. Spleen:  Within normal limits in size and appearance. Adrenals/Urinary Tract:  Normal adrenal glands. No nephrolithiasis or kidney mass identified. No hydronephrosis. Simple appearing cyst arises off the upper pole of the left kidney measuring 1.2 cm, image 22/5. Several too small to reliably characterize T2 hyperintense kidney lesions are noted bilaterally, which are likely benign cysts. No follow-up imaging recommended. Stomach/Bowel: Visualized portions within the abdomen are unremarkable. Vascular/Lymphatic: Aortic atherosclerosis. No abdominal adenopathy. Other: There is a trace amount of tracking along the left retroperitoneum. No discrete fluid collections. Musculoskeletal: No suspicious bone lesions identified. IMPRESSION: 1. Imaging findings compatible with acute pancreatitis. No signs of pancreatic necrosis or pseudocyst formation. 2. Gallstones. No signs of choledocholithiasis or biliary dilatation. 3. Hepatic steatosis. 4. Trace bilateral pleural fluid and mild interstitial edema noted within the lung bases. Electronically Signed   By: Kerby Moors M.D.   On: 03/06/2022 05:11   ECHOCARDIOGRAM COMPLETE  Result Date: 03/05/2022    ECHOCARDIOGRAM REPORT   Patient Name:   AILI FIEBELKORN Date of Exam: 03/05/2022 Medical Rec #:  [id number]        Height:       67.0 in Accession #:    [id number]       Weight:       236.0 lb Date of Birth:  January 17, 1956         BSA:          2.170 m Patient Age:    68 years         BP:           156/75 mmHg Patient Gender: F                HR:            78 bpm. Exam Location:  Inpatient Procedure: 2D Echo, Cardiac Doppler and Color Doppler Indications:    R94.31 Abnormal EKG, Dyspnea  History:        Patient has no prior history of Echocardiogram examinations.                 Risk Factors:Dyslipidemia and Hypertension.  Sonographer:    Phineas Douglas Referring Phys: [id number] [place]  1. Left ventricular ejection fraction, by estimation, is 60 to 65%. The left ventricle has normal function. The left ventricle has no regional wall motion abnormalities. Left ventricular diastolic parameters are indeterminate.  2. Right ventricular systolic function is normal. The right ventricular size is normal. There is normal pulmonary artery systolic pressure.  3. Left atrial size was mildly dilated.  4. The mitral valve is normal in structure. Trivial mitral valve regurgitation. No evidence of mitral stenosis.  5. The aortic valve is tricuspid. There is mild calcification of the aortic valve. Aortic valve regurgitation is not visualized. No aortic stenosis is present.  6. The inferior vena cava is dilated in size with >50% respiratory  variability, suggesting right atrial pressure of 8 mmHg. Comparison(s): No prior Echocardiogram. Conclusion(s)/Recommendation(s): Normal biventricular function without evidence of hemodynamically significant valvular heart disease. FINDINGS  Left Ventricle: Left ventricular ejection fraction, by estimation, is 60 to 65%. The left ventricle has normal function. The left ventricle has no regional wall motion abnormalities. The left ventricular internal cavity size was normal in size. There is  no left ventricular hypertrophy. Left ventricular diastolic parameters are indeterminate. Right Ventricle: The right ventricular size is normal. No increase in right ventricular wall thickness. Right ventricular systolic function is normal. There is normal pulmonary artery systolic pressure. The tricuspid regurgitant velocity is 2.52  m/s, and  with an assumed right atrial pressure of 8 mmHg, the estimated right ventricular systolic pressure is [id number] mmHg. Left Atrium: Left atrial size was mildly dilated. Right Atrium: Right atrial size was normal in size. Pericardium: There is no evidence of pericardial effusion. Mitral Valve: The mitral valve is normal in structure. Trivial mitral valve regurgitation. No evidence of mitral valve stenosis. Tricuspid Valve: The tricuspid valve is normal in structure. Tricuspid valve regurgitation is trivial. No evidence of tricuspid stenosis. Aortic Valve: The aortic valve is tricuspid. There is mild calcification of the aortic valve. Aortic valve regurgitation is not visualized. No aortic stenosis is present. Pulmonic Valve: The pulmonic valve was grossly normal. Pulmonic valve regurgitation is not visualized. No evidence of pulmonic stenosis. Aorta: The aortic root, ascending aorta, aortic arch and descending aorta are all structurally normal, with no evidence of dilitation or obstruction. Venous: The inferior vena cava is dilated in size with greater than 50% respiratory variability, suggesting right atrial pressure of 8 mmHg. IAS/Shunts: No atrial level shunt detected by color flow Doppler.  LEFT VENTRICLE PLAX 2D LVIDd:         4.20 cm      Diastology LVIDs:         2.70 cm      LV e' medial:    6.31 cm/s LV PW:         0.80 cm      LV E/e' medial:  19.3 LV IVS:        0.80 cm      LV e' lateral:   5.71 cm/s LVOT diam:     1.80 cm      LV E/e' lateral: 21.4 LV SV:         65 LV SV Index:   30 LVOT Area:     2.54 cm  LV Volumes (MOD) LV vol d, MOD A2C: 128.0 ml LV vol d, MOD A4C: 127.0 ml LV vol s, MOD A2C: 51.0 ml LV vol s, MOD A4C: 44.2 ml LV SV MOD A2C:     77.0 ml LV SV MOD A4C:     127.0 ml LV SV MOD BP:      79.6 ml RIGHT VENTRICLE             IVC RV Basal diam:  3.40 cm     IVC diam: 2.20 cm RV S prime:     17.20 cm/s TAPSE (M-mode): 2.2 cm LEFT ATRIUM             Index        RIGHT ATRIUM            Index LA diam:        4.10 cm 1.89 cm/m   RA Area:     16.40 cm LA Vol (A2C):   64.3 ml 29.64 ml/m  RA Volume:   35.80 ml  16.50 ml/m LA Vol (A4C):   62.6 ml 28.85 ml/m LA Biplane Vol: 68.7 ml 31.66 ml/m  AORTIC VALVE LVOT Vmax:   105.00 cm/s LVOT Vmean:  69.100 cm/s LVOT VTI:    0.254 m  AORTA Ao Root diam: 3.10 cm Ao Asc diam:  3.10 cm MITRAL VALVE                TRICUSPID VALVE MV Area (PHT): 3.21 cm     TR Peak grad:   25.4 mmHg MV Decel Time: 236 msec     TR Vmax:        252.00 cm/s MV E velocity: 122.00 cm/s MV A velocity: 167.00 cm/s  SHUNTS MV E/A ratio:  0.73         Systemic VTI:  0.25 m                             Systemic Diam: 1.80 cm Buford Dresser MD Electronically signed by Buford Dresser MD Signature Date/Time: 03/05/2022/4:29:19 PM    Final    US Abdomen Limited RUQ (LIVER/GB)  Result Date: 03/05/2022 CLINICAL DATA:  [id number] with epigastric pain. EXAM: ULTRASOUND ABDOMEN LIMITED RIGHT UPPER QUADRANT COMPARISON:  None Available. FINDINGS: Gallbladder: There is a large 5 cm shadowing stone in the gallbladder fundus. The gallbladder free wall thickness is normal at 1.9 mm and there is no positive sonographic Murphy's sign or pericholecystic fluid. Common bile duct: Diameter: Prominent measuring 7.7 mm. No intrahepatic bile duct dilatation is seen. Liver: No focal lesion identified. There is increased parenchymal echogenicity consistent with steatosis. Portal vein is patent on color Doppler imaging with normal direction of blood flow towards the liver. Other: None. IMPRESSION: 1. Cholelithiasis without evidence of acute cholecystitis. 2. Prominent common bile duct at 7.7 mm. No intrahepatic bile duct dilatation. 3. Hepatic steatosis. Electronically Signed   By: Telford Nab M.D.   On: 03/05/2022 05:06   MM 3D SCREEN BREAST BILATERAL  Result Date: 03/01/2022 CLINICAL DATA:  Screening. EXAM: DIGITAL SCREENING BILATERAL MAMMOGRAM WITH TOMOSYNTHESIS AND CAD TECHNIQUE: Bilateral  screening digital craniocaudal and mediolateral oblique mammograms were obtained. Bilateral screening digital breast tomosynthesis was performed. The images were evaluated with computer-aided detection. COMPARISON:  Previous exam(s). ACR Breast Density Category c: The breast tissue is heterogeneously dense, which may obscure small masses. FINDINGS: There are no findings suspicious for malignancy. IMPRESSION: No mammographic evidence of malignancy. A result letter of this screening mammogram will be mailed directly to the patient. RECOMMENDATION: Screening mammogram in one year. (Code:SM-B-01Y) BI-RADS CATEGORY  1: Negative. Electronically Signed   By: Margarette Canada M.D.   On: 03/01/2022 12:49    Microbiology: No results found for this or any previous visit (from the past 240 hour(s)).   Labs: Basic Metabolic Panel: Recent Labs  Lab 03/05/22 0314 03/05/22 0325 03/06/22 0442 03/07/22 0449  NA 141  --  141 139  K 3.1*  --  3.6 4.0  CL 105  --  107 107  CO2 24  --  25 24  GLUCOSE 118*  --  98 110*  BUN 30*  --  20 20  CREATININE 0.76  --  0.70 0.74  CALCIUM 9.0  --  8.1* 8.5*  MG  --  1.7  --  1.7  PHOS  --  4.2  --  3.7   Liver Function Tests: Recent Labs  Lab 03/05/22 0314 03/06/22 0442  03/07/22 0449  AST 87* 61* 48*  ALT 37 73* 62*  ALKPHOS 76 79 73  BILITOT 0.6 1.0 0.6  PROT 6.7 5.7* 6.2*  ALBUMIN 3.8 3.2* 3.3*   Recent Labs  Lab 03/05/22 0314 03/06/22 0442 03/07/22 0449  LIPASE 2,379* 105* 33   No results for input(s): "AMMONIA" in the last 168 hours. CBC: Recent Labs  Lab 03/05/22 0314 03/06/22 0442 03/07/22 0449  WBC 9.2 6.6 9.2  NEUTROABS  --   --  7.3  HGB 12.1 10.3* 10.7*  HCT 37.9 32.7* 33.6*  MCV 92.0 93.4 92.8  PLT 281 210 236   Cardiac Enzymes: No results for input(s): "CKTOTAL", "CKMB", "CKMBINDEX", "TROPONINI" in the last 168 hours. BNP: BNP (last 3 results) No results for input(s): "BNP" in the last 8760 hours.  ProBNP (last 3 results) No  results for input(s): "PROBNP" in the last 8760 hours.  CBG: No results for input(s): "GLUCAP" in the last 168 hours.     Signed:  Irine Seal MD.  [facility] 03/07/2022, 2:36 PM

## 2022-03-07 NOTE — Progress Notes (Signed)
Discharge instructions discussed with patient, verbalized agreement and understanding 

## 2022-03-07 NOTE — Progress Notes (Signed)
Progress Note: General Surgery Service   Chief Complaint/Subjective: Feels well today.  Having only minimal soreness.  Tolerating solid diet with no nausea.  Objective: Vital signs in last 24 hours: Temp:  [97.4 F (36.3 C)-98.6 F (37 C)] 98.3 F (36.8 C) (02/14 0617) Pulse Rate:  [63-79] 63 (02/14 0617) Resp:  [11-18] 18 (02/14 0617) BP: (100-180)/(70-99) 167/80 (02/14 0617) SpO2:  [93 %-99 %] 97 % (02/14 0617) Weight:  OH:[id number] kg] 107 kg (02/13 1031)    Intake/Output from previous day: 02/13 0701 - 02/14 0700 In: 2699.5 [P.O.:960; I.V.:1539.5; IV Piggyback:200] Out: 1360 [Urine:1350; Blood:10] Intake/Output this shift: No intake/output data recorded.  Abd: soft, minimally tender, +BS, ND, incisions c/d/i  Lab Results: CBC  Recent Labs    03/06/22 0442 03/07/22 0449  WBC 6.6 9.2  HGB 10.3* 10.7*  HCT 32.7* 33.6*  PLT 210 236   BMET Recent Labs    03/06/22 0442 03/07/22 0449  NA 141 139  K 3.6 4.0  CL 107 107  CO2 25 24  GLUCOSE 98 110*  BUN 20 20  CREATININE 0.70 0.74  CALCIUM 8.1* 8.5*   PT/INR No results for input(s): "LABPROT", "INR" in the last 72 hours. ABG No results for input(s): "PHART", "HCO3" in the last 72 hours.  Invalid input(s): "PCO2", "PO2"  Anti-infectives: Anti-infectives (From admission, onward)    Start     Dose/Rate Route Frequency Ordered Stop   03/06/22 1400  ceFAZolin (ANCEF) IVPB 2g/100 mL premix        2 g 200 mL/hr over 30 Minutes Intravenous Every 8 hours 03/06/22 0936         Medications: Scheduled Meds:  acetaminophen  650 mg Oral Q6H   docusate sodium  100 mg Oral BID   gabapentin  300 mg Oral TID   lisinopril  20 mg Oral BID   And   hydrochlorothiazide  12.5 mg Oral BID   lip balm   Topical BID   pantoprazole  40 mg Oral BID   polycarbophil  625 mg Oral BID   Continuous Infusions:  sodium chloride     sodium chloride      ceFAZolin (ANCEF) IV 2 g (03/07/22 0621)   magnesium sulfate bolus IVPB      methocarbamol (ROBAXIN) IV     PRN Meds:.sodium chloride, acetaminophen, acetaminophen, alum & mag hydroxide-simeth, bisacodyl, diazepam, HYDROmorphone (DILAUDID) injection, HYDROmorphone (DILAUDID) injection, magic mouthwash, menthol-cetylpyridinium, methocarbamol (ROBAXIN) IV, methocarbamol, ondansetron (ZOFRAN) IV, oxyCODONE, oxyCODONE, oxyCODONE, phenol, prochlorperazine, prochlorperazine, simethicone  Assessment/Plan: POD 1, s/p lap chole for Gallstone pancreatitis by Dr. Thermon Leyland on 2/13 -labs look good today -tolerating a diet -pain very well controlled -patient is surgically stable  -discussed with primary service.  Will send scripts and follow up for patient.  Have gone over DC instructions as well.  FEN - regular VTE - SCDs ID - no abx needed    LOS: 2 days     Henreitta Cea, [facility] Use [email or web address] to contact on call provider  Daily Billing: [phone] - High MDM

## 2022-03-07 NOTE — Evaluation (Signed)
Occupational Therapy Evaluation Patient Details Name: Casey Larsen MRN: [id number] DOB: 1955/09/10 Today's Date: 03/07/2022   History of Present Illness Patient is a 67 year old female who presented on 2/12 with sever abdominal pain. Patient was admitted with acute gallstone pancreatitis. Patient underwent laparoscopic cholecystectomy on 2/13. PMH: GERD, depression, asthmatic bronchitis, TKA, HTN,   Clinical Impression   Patient evaluated by Occupational Therapy with no further acute OT needs identified. All education has been completed and the patient has no further questions. Patient is MI at this time for ADLs. Patient has sister support as needed at home at time of d/c. See below for any follow-up Occupational Therapy or equipment needs. OT is signing off. Thank you for this referral.       Recommendations for follow up therapy are one component of a multi-disciplinary discharge planning process, led by the attending physician.  Recommendations may be updated based on patient status, additional functional criteria and insurance authorization.   Follow Up Recommendations  No OT follow up     Assistance Recommended at Discharge PRN     Functional Status Assessment  Patient has not had a recent decline in their functional status  Equipment Recommendations  None recommended by OT       Precautions / Restrictions Precautions Precaution Comments: abdominal surgery      Mobility Bed Mobility Overal bed mobility: Modified Independent      Transfers Overall transfer level: Modified independent        Balance Overall balance assessment: No apparent balance deficits (not formally assessed)           ADL either performed or assessed with clinical judgement   ADL Overall ADL's : Modified independent       General ADL Comments: patient was able to complete toileting tasks, LB dressing with figure four education, functional mobility in hallway with MI. patient was  educated on limiting bending and twisting. patient verbalized understand reported that sister is at home with her and has reacher as [email or web address] on moving in hallway multiple times a day to maintain functional activity tolerance patient verbalized understanding     Vision Baseline Vision/History: 1 Wears glasses              Pertinent Vitals/Pain Pain Assessment Pain Assessment: 0-10 Pain Score: 3  Pain Location: abdomen Pain Descriptors / Indicators: Discomfort Pain Intervention(s): Limited activity within patient's tolerance, Monitored during session     Hand Dominance Right   Extremity/Trunk Assessment Upper Extremity Assessment Upper Extremity Assessment: Overall WFL for tasks assessed   Lower Extremity Assessment Lower Extremity Assessment: Overall WFL for tasks assessed   Cervical / Trunk Assessment Cervical / Trunk Assessment: Normal   Communication Communication Communication: No difficulties   Cognition Arousal/Alertness: Awake/alert Behavior During Therapy: WFL for tasks assessed/performed Overall Cognitive Status: Within Functional Limits for tasks assessed       General Comments: very plesant                Home Living Family/patient expects to be discharged to:: Private residence Living Arrangements: Other relatives (sister) Available Help at Discharge: Family;Available 24 hours/day Type of Home: House Home Access: Level entry     Home Layout: One level     Bathroom Shower/Tub: Walk-in shower         Home Equipment: Conservation officer, nature (2 wheels)          Prior Functioning/Environment Prior Level of Function : Independent/Modified Independent  OT Goals(Current goals can be found in the care plan section) Acute Rehab OT Goals OT Goal Formulation: All assessment and education complete, DC therapy  OT Frequency:         AM-PAC OT "6 Clicks" Daily Activity     Outcome Measure Help from  another person eating meals?: None Help from another person taking care of personal grooming?: None Help from another person toileting, which includes using toliet, bedpan, or urinal?: None Help from another person bathing (including washing, rinsing, drying)?: None Help from another person to put on and taking off regular upper body clothing?: None Help from another person to put on and taking off regular lower body clothing?: None 6 Click Score: 24   End of Session Nurse Communication: Other (comment) (ok to participate in session)  Activity Tolerance: Patient tolerated treatment well Patient left: in chair;with call bell/phone within reach  OT Visit Diagnosis: Pain                Time: [id number] OT Time Calculation (min): 12 min Charges:  OT General Charges $OT Visit: 1 Visit OT Evaluation $OT Eval Low Complexity: 1 Low  Ridley Schewe OTR/L, MS Acute Rehabilitation Department Office# [phone]   Willa Rough 03/07/2022, 11:43 AM

## 2022-03-07 NOTE — Progress Notes (Signed)
PT Cancellation Note  Patient Details Name: Casey Larsen MRN: [id number] DOB: Sep 07, 1955   Cancelled Treatment:    Reason Eval/Treat Not Completed: PT screened, no needs identified, will sign off   Claretha Cooper 03/07/2022, 12:48 PM

## 2022-03-12 ENCOUNTER — Telehealth: Payer: Self-pay | Admitting: Internal Medicine

## 2022-03-12 NOTE — Telephone Encounter (Signed)
Pt states she had her gallbladder removed and was told she needed to follow-up with Dr. Henrene Pastor. Pt states she is not having any issues at this time. She wants to know if Dr. Henrene Pastor really needs her to come in, she knows Dr. Henrene Pastor is out of the office. Please advise.

## 2022-03-12 NOTE — Telephone Encounter (Signed)
Inbound call from patient stating that she was seen on 2/12 at [facility] and had her gallbladder removed and was advised to follow up with Dr. Henrene Pastor in 4 weeks. Patient is requesting a call back to discuss if the appointment is necessary. Please advise.

## 2022-03-13 LAB — SURGICAL PATHOLOGY

## 2022-03-15 NOTE — Telephone Encounter (Signed)
Spoke with pt and she is aware.  

## 2022-03-15 NOTE — Telephone Encounter (Signed)
Hospitalization reviewed. Routine post op surgical follow up recommended. Glad she's feeling well. No need for GI follow up. Thanks, JP

## 2023-01-24 ENCOUNTER — Other Ambulatory Visit: Payer: Self-pay | Admitting: Internal Medicine

## 2023-01-24 DIAGNOSIS — Z0000 Encounter for general adult medical examination without abnormal findings: Secondary | ICD-10-CM

## 2023-03-04 ENCOUNTER — Ambulatory Visit
Admission: RE | Admit: 2023-03-04 | Discharge: 2023-03-04 | Disposition: A | Discharge status: Home / Self Care | Payer: Commercial Managed Care - PPO | Source: Ambulatory Visit | Attending: Internal Medicine | Admitting: Internal Medicine

## 2023-03-04 DIAGNOSIS — Z0000 Encounter for general adult medical examination without abnormal findings: Secondary | ICD-10-CM

## 2023-04-29 IMAGING — MG MM DIGITAL SCREENING BILAT W/ TOMO AND CAD
8 series · 8 of 24 positions shown · non-contrast
Comparison: Previous exam(s).

CLINICAL DATA: Screening.

EXAM:
DIGITAL SCREENING BILATERAL MAMMOGRAM WITH TOMOSYNTHESIS AND CAD
TECHNIQUE: Bilateral screening digital craniocaudal and mediolateral oblique
mammograms were obtained. Bilateral screening digital breast
tomosynthesis was performed. The images were evaluated with
computer-aided detection.

[R CC synth-2D]
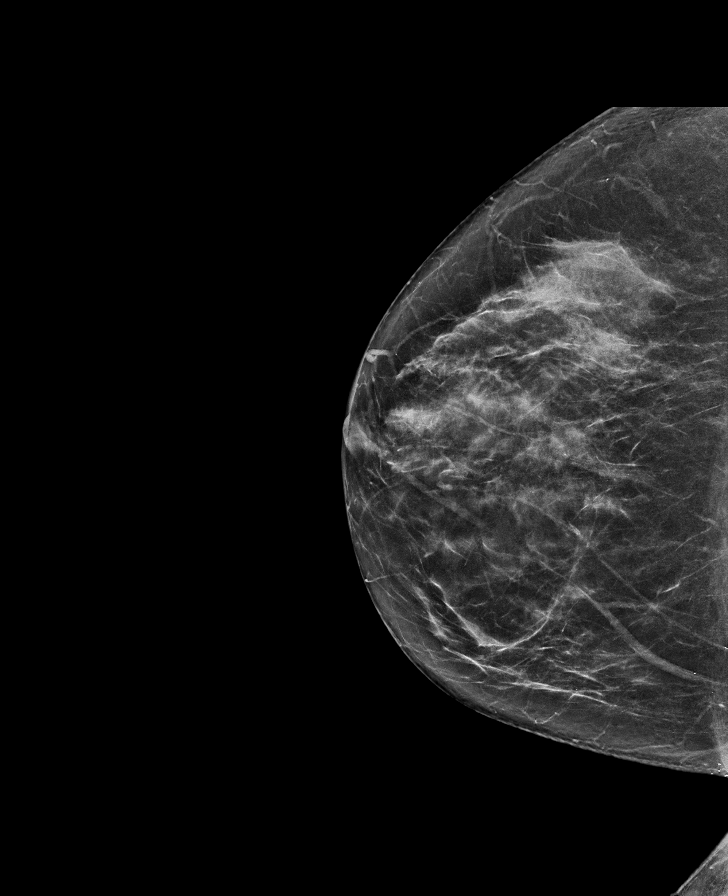

[L MLO synth-2D]
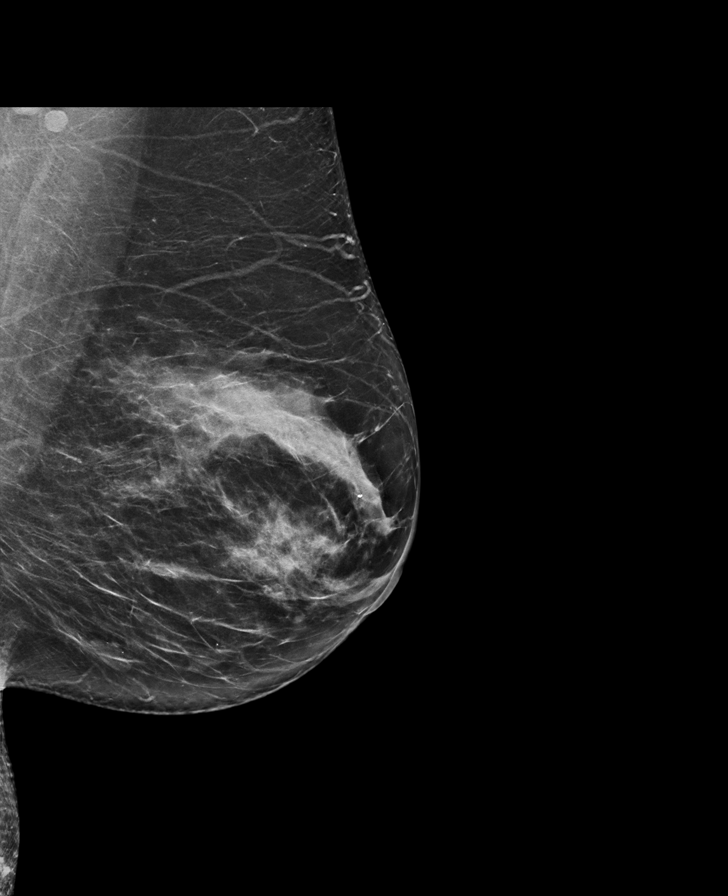

[L CC synth-2D]
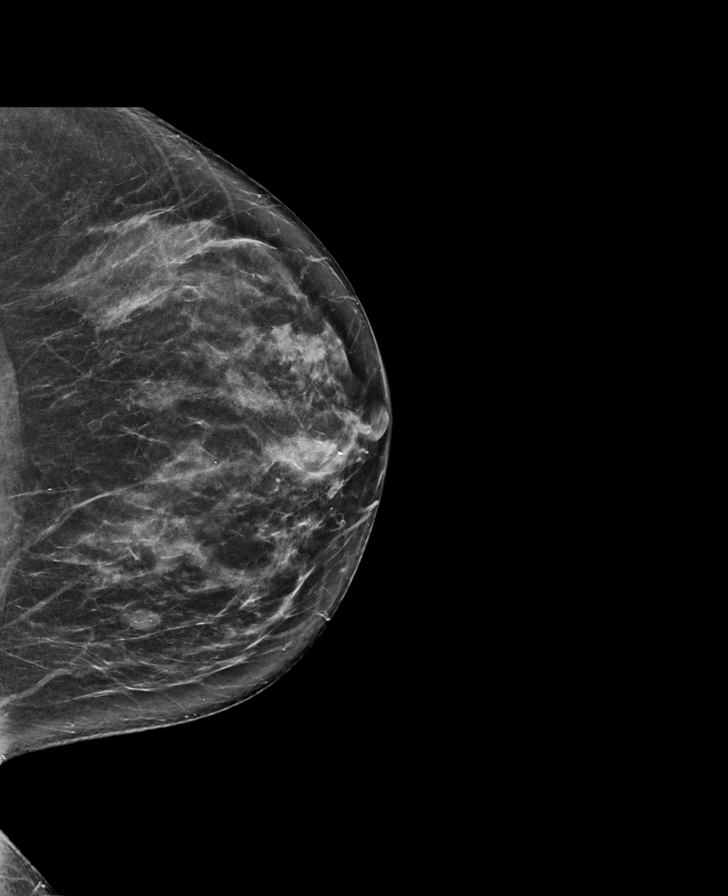

[R MLO synth-2D]
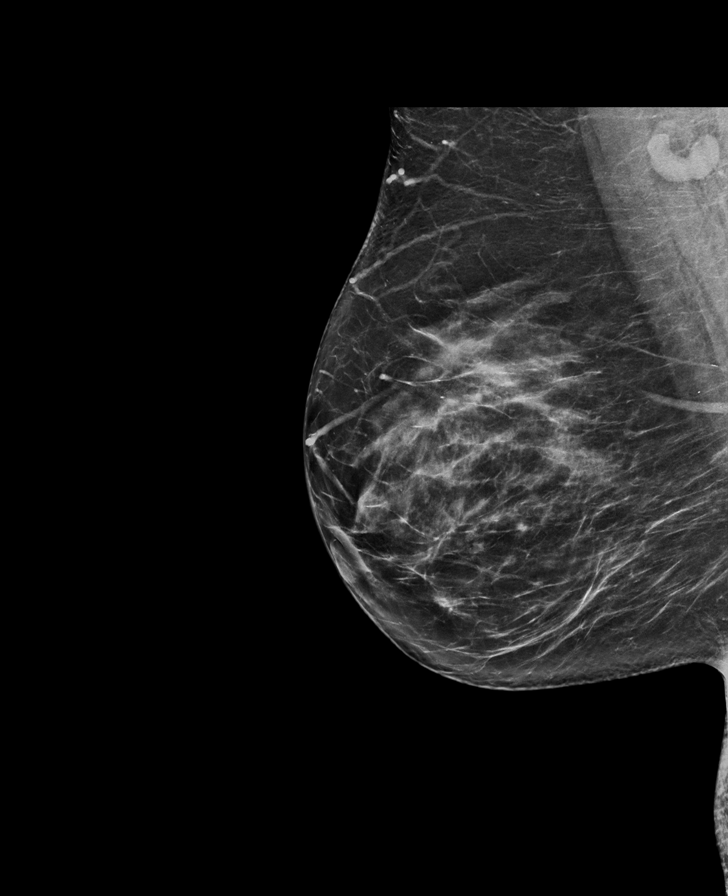

[L MLO tomo · tomo slice 41/80.0]
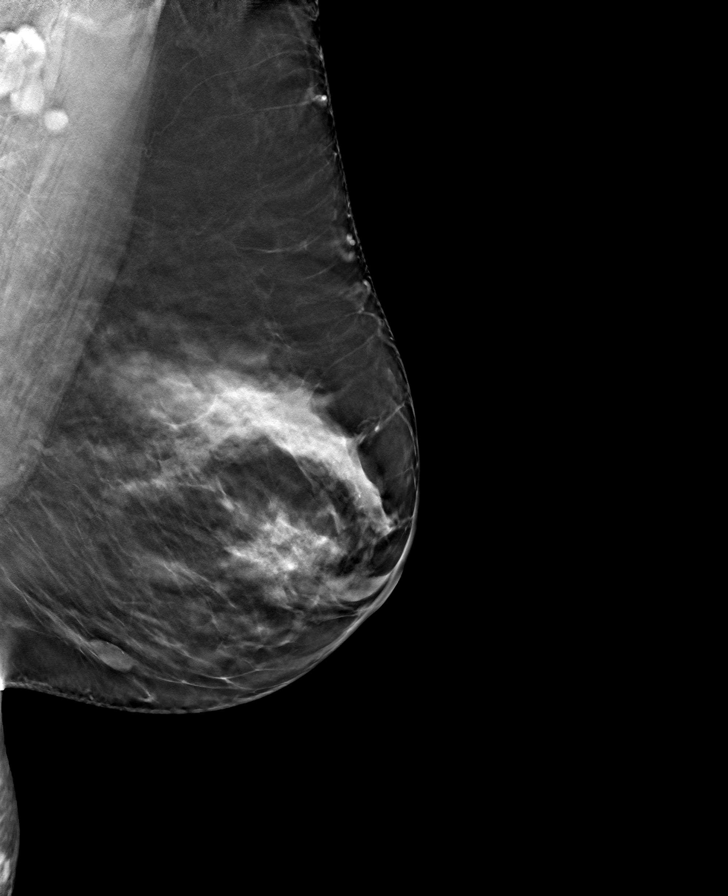

[R MLO tomo · tomo slice 39/77.0]
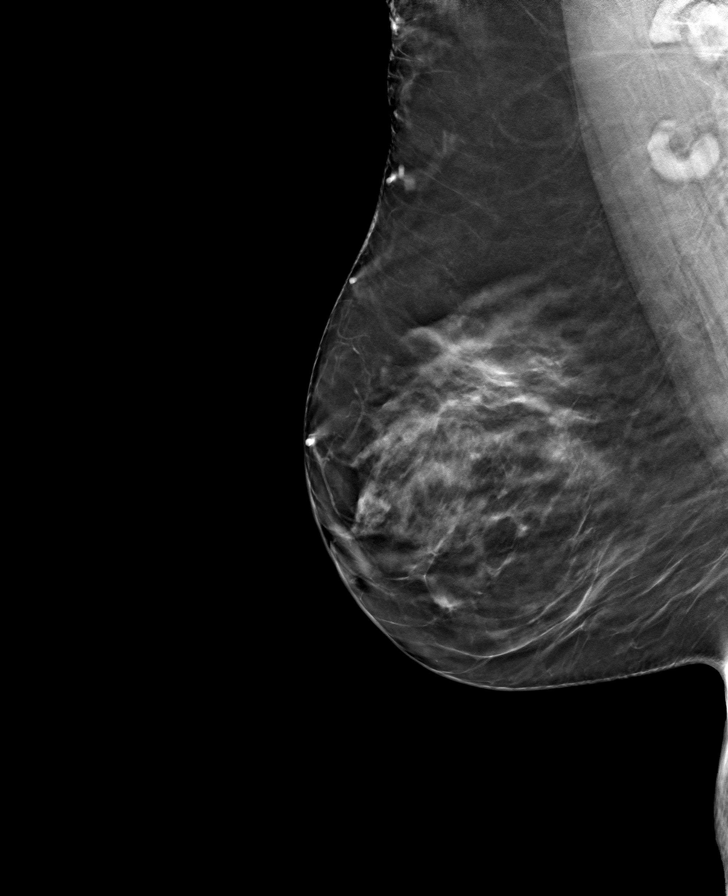

[L CC tomo · tomo slice 35/70.0]
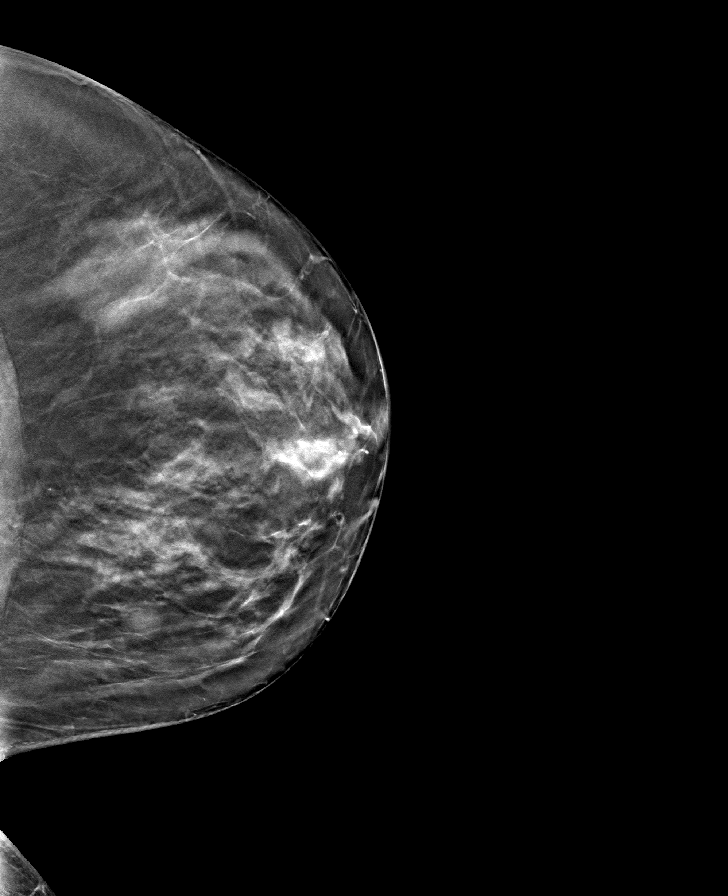

[R CC tomo · tomo slice 35/68.0]
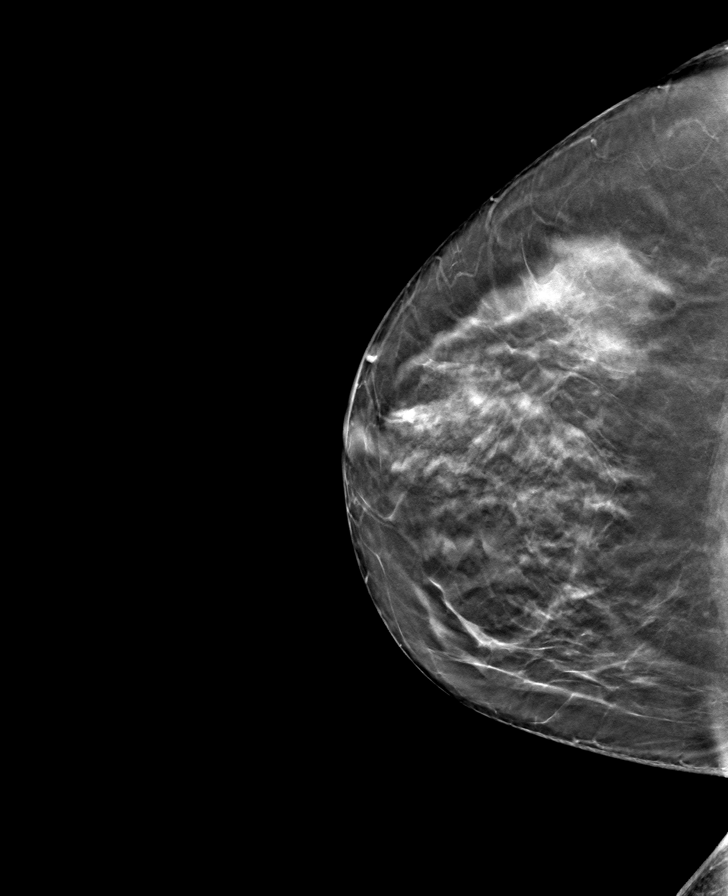

[8 of 24 positions shown; findings below may reference images not displayed]

ACR Breast Density Category c: The breast tissue is heterogeneously
dense, which may obscure small masses.
FINDINGS: There are no findings suspicious for malignancy.
IMPRESSION: No mammographic evidence of malignancy. A result letter of this
screening mammogram will be mailed directly to the patient.

RECOMMENDATION:
Screening mammogram in one year. (Code:[id number])

BI-RADS CATEGORY  1: Negative.

## 2024-01-01 ENCOUNTER — Ambulatory Visit: Admitting: Podiatry

## 2024-01-27 ENCOUNTER — Ambulatory Visit (HOSPITAL_COMMUNITY)
Admission: RE | Admit: 2024-01-27 | Discharge: 2024-01-27 | Disposition: A | Discharge status: Home / Self Care | Source: Ambulatory Visit | Attending: Physician Assistant | Admitting: Physician Assistant

## 2024-01-27 VITALS — BP 144/64 | HR 52 | Ht 67.0 in | Wt 225.6 lb

## 2024-01-27 DIAGNOSIS — D6869 Other thrombophilia: Secondary | ICD-10-CM

## 2024-01-27 DIAGNOSIS — I5020 Unspecified systolic (congestive) heart failure: Secondary | ICD-10-CM

## 2024-01-27 DIAGNOSIS — I5022 Chronic systolic (congestive) heart failure: Secondary | ICD-10-CM | POA: Diagnosis not present

## 2024-01-27 DIAGNOSIS — I480 Paroxysmal atrial fibrillation: Secondary | ICD-10-CM | POA: Diagnosis not present

## 2024-01-27 DIAGNOSIS — I2510 Atherosclerotic heart disease of native coronary artery without angina pectoris: Secondary | ICD-10-CM | POA: Diagnosis not present

## 2024-01-27 DIAGNOSIS — R0683 Snoring: Secondary | ICD-10-CM | POA: Diagnosis not present

## 2024-01-27 DIAGNOSIS — I4891 Unspecified atrial fibrillation: Secondary | ICD-10-CM | POA: Diagnosis not present

## 2024-01-27 MED ORDER — METOPROLOL SUCCINATE ER 100 MG PO TB24: 100.0000 mg | ORAL_TABLET | Freq: Every day | ORAL | 3 refills | Status: AC

## 2024-01-27 MED ORDER — APIXABAN 5 MG PO TABS: 5.0000 mg | ORAL_TABLET | Freq: Two times a day (BID) | ORAL | 3 refills | Status: AC

## 2024-01-27 MED ORDER — SPIRONOLACTONE 25 MG PO TABS: 25.0000 mg | ORAL_TABLET | Freq: Every day | ORAL | 3 refills | Status: AC

## 2024-01-27 MED ORDER — FARXIGA 10 MG PO TABS: 10.0000 mg | ORAL_TABLET | Freq: Every day | ORAL | 3 refills | Status: AC

## 2024-01-27 MED ORDER — LISINOPRIL 40 MG PO TABS: 40.0000 mg | ORAL_TABLET | Freq: Every day | ORAL | 3 refills | Status: DC

## 2024-01-27 NOTE — Addendum Note (Signed)
 Encounter addended by: Nellene Quita SAUNDERS, PA on: 01/27/2024 4:42 PM  Actions taken: Level of Service modified, Visit diagnoses modified

## 2024-01-27 NOTE — Progress Notes (Signed)
 "   Primary Care Physician: Shepard Ade, MD Primary Cardiologist: None Electrophysiologist: None  Referring Physician: Dr Shepard Adrien [name] is a 69 y.o. female with a history of HLD, HTN, atrial fibrillation who presents for consultation in the [facility].  The patient was initially diagnosed with atrial fibrillation 01/01/24 after presenting to the ED with symptoms of SOB. With EMS, she was transiently placed on BIPAP for acute respiratory distress. ECG showed afib with RVR. Magnesium  was 1.3 and K+ was 2.7. Troponin elevated at 447, felt by inpatient cardiology team to be demand ischemia related to afib. Patient spontaneously converted to SR. She was started on Eliquis  for stroke prevention and flecainide for rhythm control. However, her EF was reduced at 40-45% and flecainide was discontinued.   Today, patient is in SR. Prior to her hospital stay, she would have intermittent SOB and fatigue on exertion. Since the hospitalization, she feels swimmy headed with exertion but this is improving. She does admit to snoring and daytime somnolence. She doesn't have any family members with afib but does have a strong family history of CAD. No bleeding issues on anticoagulation.   Today, she denies symptoms of palpitations, chest pain, orthopnea, PND, lower extremity edema, presyncope, syncope, bleeding, or neurologic sequela. The patient is tolerating medications without difficulties and is otherwise without complaint today.    Atrial Fibrillation Risk Factors:  she does have symptoms or diagnosis of sleep apnea. she does not have a history of rheumatic fever. she does not have a history of alcohol use. The patient does not have a history of early familial atrial fibrillation or other arrhythmias.  Atrial Fibrillation Management history:  Previous antiarrhythmic drugs: flecainide  Previous cardioversions: none Previous ablations: none Anticoagulation  history: Eliquis   ROS- All systems are reviewed and negative except as per the HPI above.  Past Medical History:  Diagnosis Date   Arthritis    knees   Asthmatic bronchitis, mild intermittent, with acute exacerbation    Depression    with death of spouse and son   Dermatophytosis of nail 03/12/2012   Diverticulosis    GERD (gastroesophageal reflux disease)    Hyperlipidemia    Hypertension    Melanoma (HCC) 2005   face   Pneumonia    PONV (postoperative nausea and vomiting)    Pulmonary nodules    UTI (urinary tract infection)    Completed Cipro Sept 25th from primary care MD    Current Outpatient Medications  Medication Sig Dispense Refill   acetaminophen  (TYLENOL ) 325 MG tablet Take 2 tablets (650 mg total) by mouth every 6 (six) hours as needed. (Patient taking differently: Take 650 mg by mouth as needed.)     albuterol (VENTOLIN HFA) 108 (90 Base) MCG/ACT inhaler Inhale 2 puffs into the lungs as needed.     apixaban  (ELIQUIS ) 5 MG TABS tablet Take 5 mg by mouth 2 (two) times daily.     Calcium  Polycarbophil (FIBERCON PO) Take 1 gummy by mouth as needed     Cholecalciferol  (VITAMIN D ) 50 MCG (2000 UT) tablet Take 2,000 Units by mouth daily.     FARXIGA  10 MG TABS tablet 1 tablet Orally Once a day     fluorouracil (EFUDEX) 5 % cream Apply 1 Application topically as needed.     furosemide (LASIX) 40 MG tablet 1 tablet Orally Once a day     lisinopril  (ZESTRIL ) 40 MG tablet Take 40 mg by mouth daily.  metoprolol  succinate (TOPROL -XL) 100 MG 24 hr tablet Take 100 mg by mouth daily.     omeprazole (PRILOSEC) 20 MG capsule Take 20 mg by mouth daily.     spironolactone  (ALDACTONE ) 25 MG tablet 1 tablet Orally Once a day     triamcinolone  lotion (KENALOG ) 0.1 % APPLY TO BACK OF NECK/SCALP AREA EVERY NIGHT AS NEEDED FOR FLARES AS DIRECTED     GLUCOSAMINE-CHONDROITIN PO Take 1 tablet by mouth daily. (Patient not taking: Reported on 01/27/2024)     No current facility-administered  medications for this encounter.    Physical Exam: BP (!) 144/64   Pulse (!) 52   Ht 5' 7 (1.702 m)   Wt 102.3 kg   BMI 35.33 kg/m   GEN: Well nourished, well developed in no acute distress CARDIAC: Regular rate and rhythm, no murmurs, rubs, gallops RESPIRATORY:  Clear to auscultation without rales, wheezing or rhonchi  ABDOMEN: Soft, non-tender, non-distended EXTREMITIES:  No edema; No deformity   Wt Readings from Last 3 Encounters:  01/27/24 102.3 kg  03/06/22 107 kg  08/17/21 107 kg     EKG Interpretation Date/Time:  Monday January 27 2024 14:39:40 EST Ventricular Rate:  52 PR Interval:  200 QRS Duration:  80 QT Interval:  430 QTC Calculation: 399 R Axis:   10  Text Interpretation: Sinus bradycardia Possible Inferior infarct (cited on or before 15-Jul-2018) Cannot rule out Anterior infarct , age undetermined Abnormal ECG When compared with ECG of 15-Jul-2018 15:10, No significant change was found Confirmed by Kauan Kloosterman ([id number]) on 01/27/2024 2:49:02 PM    Echo 01/02/24 at [facility] demonstrated  A two-dimensional transthoracic echocardiogram with color flow, Doppler and global longitudinal  strain imaging was performed. Image Quality  Technically adequate.  The left ventricular size is normal.  There is borderline concentric left ventricular hypertrophy.  There is mild global hypokinesis of the left ventricle.  LV ejection fraction = 40-45%.  The left atrium is moderately dilated.  The right atrium is mildly dilated.  There is trace aortic regurgitation.  There is moderate mitral regurgitation.  There is mild to moderate mitral valve thickening.  There is moderate tricuspid regurgitation.  Moderate pulmonary hypertension.  There is no pericardial effusion.  IVC size was mildly dilated.  The ascending aorta is normal size.  There is no comparison study available.    CHA2DS2-VASc Score = 4  The patient's score is based upon: CHF History: 1 HTN History:  1 Diabetes History: 0 Stroke History: 0 Vascular Disease History: 0 Age Score: 1 Gender Score: 1       ASSESSMENT AND PLAN: Paroxysmal Atrial Fibrillation (ICD10:  I48.0) The patient's CHA2DS2-VASc score is 4, indicating a 4.8% annual risk of stroke.   General education about afib provided and questions answered. We also discussed her stroke risk and the risks and benefits of anticoagulation. Patient in SR today.  We discussed rhythm control options today including AAD and ablation. Would avoid class IC and Multaq with reduced EF. Can consider dofetilide or amiodarone. She would prefer consultation for ablation, will refer to EP.  Continue Eliquis  5 mg BID Continue Toprol  100 mg daily  Secondary Hypercoagulable State (ICD10:  D68.69) The patient is at significant risk for stroke/thromboembolism based upon her CHA2DS2-VASc Score of 4.  Continue Apixaban  (Eliquis ). No bleeding issues.   Chronic HFrEF EF 40-45% Suspected related to afib with RVR. Echo in 2024 showed normal LVEF. GDMT started during hospital stay.  Fluid status appears stable today Will refer  to establish care with a primary cardiologist.   HTN Stable on current regimen  VHD Moderate MR Moderate TR Unclear if echo at [facility] was performed while patient was in afib.  Can reassess valves when LVEF is reassessed.   CAD Severe coronary artery calcification noted on CT at [facility] 01/02/24 Troponin elevated up to 447 at hospital, felt to be demand ischemia due to afib with RVR. She does have risk factors for ischemic heart disease including HLD and a strong family history.  No anginal symptoms currently.  Referring to establish care with a primary cardiologist as above.  If EF doesn't normalize with SR, may need ischemic evaluation.   Snoring/daytime somnolence  The importance of adequate treatment of sleep apnea was discussed today in order to improve our ability to maintain sinus rhythm long term. Will refer for  sleep study.   Obesity Body mass index is 35.33 kg/m.  Encouraged lifestyle modification   Follow up with EP and primary cardiologist to establish care.    Total time of encounter: 53 minutes total time of encounter, including chart review, face-to-face patient care, coordination of care and counseling regarding high complexity medical decision making.   [facility] [address] [place], [place] [address] [phone] "

## 2024-02-03 ENCOUNTER — Ambulatory Visit: Admitting: Cardiology

## 2024-02-07 ENCOUNTER — Other Ambulatory Visit (HOSPITAL_COMMUNITY): Payer: Self-pay

## 2024-02-07 ENCOUNTER — Ambulatory Visit
Attending: Student in an Organized Health Care Education/Training Program | Admitting: Student in an Organized Health Care Education/Training Program

## 2024-02-07 ENCOUNTER — Encounter: Payer: Self-pay | Admitting: Student in an Organized Health Care Education/Training Program

## 2024-02-07 VITALS — BP 140/60 | HR 53 | Ht 67.0 in | Wt 229.4 lb

## 2024-02-07 DIAGNOSIS — I5020 Unspecified systolic (congestive) heart failure: Secondary | ICD-10-CM | POA: Diagnosis not present

## 2024-02-07 DIAGNOSIS — I2510 Atherosclerotic heart disease of native coronary artery without angina pectoris: Secondary | ICD-10-CM

## 2024-02-07 DIAGNOSIS — I4891 Unspecified atrial fibrillation: Secondary | ICD-10-CM | POA: Diagnosis not present

## 2024-02-07 DIAGNOSIS — I10 Essential (primary) hypertension: Secondary | ICD-10-CM | POA: Diagnosis not present

## 2024-02-07 MED ORDER — ROSUVASTATIN CALCIUM 20 MG PO TABS
20.0000 mg | ORAL_TABLET | Freq: Every day | ORAL | 3 refills | Status: AC
  Filled 2024-02-07: qty 90, 90d supply, fill #0

## 2024-02-07 MED ORDER — SACUBITRIL-VALSARTAN 49-51 MG PO TABS
1.0000 | ORAL_TABLET | Freq: Two times a day (BID) | ORAL | 3 refills | Status: AC
  Filled 2024-02-07: qty 60, 30d supply, fill #0

## 2024-02-07 NOTE — Progress Notes (Signed)
 " Cardiology Office Note:  .   Date:  02/07/2024  ID:  Casey Larsen, DOB Apr 17, 1955, MRN [id number] PCP: Shepard Ade, MD  [facility] Providers Cardiologist:  Georganna Archer, MD { Chief Complaint:  Chief Complaint  Patient presents with   Atrial Fibrillation    History of Present Illness: .    Casey Larsen is a 69 y.o. female with a PMH of HFmrEF, HTN, HLD, new onset PAF and obesity who presents as a new patient referral for the evaluation of new onset atrial fibrillation.  Discussed the use of AI scribe software for clinical note transcription with the patient, who gave verbal consent to proceed.  History of Present Illness Casey Larsen is a 69 year old female with atrial fibrillation who presents for follow-up after hospitalization.  The patient was recently diagnosed with atrial fibrillation on 01/01/2024 at presents the ED with shortness of breath.  She was found to have multiple electrolyte derangements including hypomagnesemia and hypokalemia.  She was found to be in new onset atrial fibrillation with RVR which converted spontaneously to NSR.  Her troponins were elevated 63 -> 270 -> 329 -> 418.  She did not have any chest pain but just shortness of breath. Over the past couple of months, she occasionally felt exhausted after getting up at night to use the bathroom and noticed her heart beating fast. These episodes resolved after she went back to sleep until one instance where she felt she couldn't breathe, prompting hospital admission. Since discharge, she initially felt lightheaded when standing, which she attributed to medication, but now feels much better.  Workup in the ED was notable for pulmonary edema with severe coronary calcifications.  Echocardiogram revealed newly reduced LVEF to 40 to 45%.  Her current medications include Eliquis  5 mg twice a day, Farxiga  10 mg once a day, Lasix 40 mg once a day, Lisinopril  40 mg once a day, Metoprolol  succinate 100  mg once a day, and Spironolactone  25 mg once a day. She experienced lightheadedness when standing after discharge, which she thought was due to her medication.  She experienced leg swelling about a month before the atrial fibrillation episode, which resolved after wearing compression stockings for a week. She has not had any swelling since then.  She has a family history of heart disease; her father died of a heart attack at 26 and had diabetes and congestive heart failure. Her sister and brother have high blood pressure. She quit smoking over 15 years ago and denies any illicit drug use.  She has a history of high blood pressure, diagnosed seven years ago. She recalls her blood pressure readings often being high, but she was not concerned as it seemed normal for her. She has been on blood pressure medication since then.  She mentions experiencing cramps in her legs occasionally, which are sore afterwards. During her hospital stay, her magnesium  and potassium levels were noted to be low.      Studies Reviewed: SABRA    EKG:  EKG Interpretation Date/Time:  Friday February 07 2024 15:00:57 EST Ventricular Rate:  53 PR Interval:  196 QRS Duration:  76 QT Interval:  420 QTC Calculation: 394 R Axis:   -1  Text Interpretation: Sinus bradycardia Minimal voltage criteria for LVH, may be normal variant ( R in aVL ) Possible Inferior infarct (cited on or before 15-Jul-2018) When compared with ECG of 27-Jan-2024 14:39, No significant change was found Confirmed by Archer Georganna [phone]) on 02/07/2024 3:03:12  PM     Cardiac Studies & Procedures   ______________________________________________________________________________________________     ECHOCARDIOGRAM  ECHOCARDIOGRAM COMPLETE 03/05/2022  Narrative ECHOCARDIOGRAM REPORT    [name] Date of Exam: 03/05/2022 Medical Rec #:  [id number]        Height:       67.0 in Accession #:    [id number]       Weight:        236.0 lb Date of Birth:  01-11-1956         BSA:          2.170 m Patient Age:    66 years         BP:           156/75 mmHg Patient Gender: F                HR:           78 bpm. Exam Location:  Inpatient  Procedure: 2D Echo, Cardiac Doppler and Color Doppler  Indications:    R94.31 Abnormal EKG, Dyspnea  History:        Patient has no prior history of Echocardiogram examinations. Risk Factors:Dyslipidemia and Hypertension.  Sonographer:    Morna Luis Referring Phys: [id number] DAVID MANUEL ORTIZ  IMPRESSIONS   1. Left ventricular ejection fraction, by estimation, is 60 to 65%. The left ventricle has normal function. The left ventricle has no regional wall motion abnormalities. Left ventricular diastolic parameters are indeterminate. 2. Right ventricular systolic function is normal. The right ventricular size is normal. There is normal pulmonary artery systolic pressure. 3. Left atrial size was mildly dilated. 4. The mitral valve is normal in structure. Trivial mitral valve regurgitation. No evidence of mitral stenosis. 5. The aortic valve is tricuspid. There is mild calcification of the aortic valve. Aortic valve regurgitation is not visualized. No aortic stenosis is present. 6. The inferior vena cava is dilated in size with >50% respiratory variability, suggesting right atrial pressure of 8 mmHg.  Comparison(s): No prior Echocardiogram.  Conclusion(s)/Recommendation(s): Normal biventricular function without evidence of hemodynamically significant valvular heart disease.  FINDINGS Left Ventricle: Left ventricular ejection fraction, by estimation, is 60 to 65%. The left ventricle has normal function. The left ventricle has no regional wall motion abnormalities. The left ventricular internal cavity size was normal in size. There is no left ventricular hypertrophy. Left ventricular diastolic parameters are indeterminate.  Right Ventricle: The right ventricular size is normal. No  increase in right ventricular wall thickness. Right ventricular systolic function is normal. There is normal pulmonary artery systolic pressure. The tricuspid regurgitant velocity is 2.52 m/s, and with an assumed right atrial pressure of 8 mmHg, the estimated right ventricular systolic pressure is 33.4 mmHg.  Left Atrium: Left atrial size was mildly dilated.  Right Atrium: Right atrial size was normal in size.  Pericardium: There is no evidence of pericardial effusion.  Mitral Valve: The mitral valve is normal in structure. Trivial mitral valve regurgitation. No evidence of mitral valve stenosis.  Tricuspid Valve: The tricuspid valve is normal in structure. Tricuspid valve regurgitation is trivial. No evidence of tricuspid stenosis.  Aortic Valve: The aortic valve is tricuspid. There is mild calcification of the aortic valve. Aortic valve regurgitation is not visualized. No aortic stenosis is present.  Pulmonic Valve: The pulmonic valve was grossly normal. Pulmonic valve regurgitation is not visualized. No evidence of pulmonic stenosis.  Aorta: The aortic root, ascending aorta, aortic arch and descending aorta are  all structurally normal, with no evidence of dilitation or obstruction.  Venous: The inferior vena cava is dilated in size with greater than 50% respiratory variability, suggesting right atrial pressure of 8 mmHg.  IAS/Shunts: No atrial level shunt detected by color flow Doppler.   LEFT VENTRICLE PLAX 2D LVIDd:         4.20 cm      Diastology LVIDs:         2.70 cm      LV e' medial:    6.31 cm/s LV PW:         0.80 cm      LV E/e' medial:  19.3 LV IVS:        0.80 cm      LV e' lateral:   5.71 cm/s LVOT diam:     1.80 cm      LV E/e' lateral: 21.4 LV SV:         65 LV SV Index:   30 LVOT Area:     2.54 cm  LV Volumes (MOD) LV vol d, MOD A2C: 128.0 ml LV vol d, MOD A4C: 127.0 ml LV vol s, MOD A2C: 51.0 ml LV vol s, MOD A4C: 44.2 ml LV SV MOD A2C:     77.0 ml LV SV  MOD A4C:     127.0 ml LV SV MOD BP:      79.6 ml  RIGHT VENTRICLE             IVC RV Basal diam:  3.40 cm     IVC diam: 2.20 cm RV S prime:     17.20 cm/s TAPSE (M-mode): 2.2 cm  LEFT ATRIUM             Index        RIGHT ATRIUM           Index LA diam:        4.10 cm 1.89 cm/m   RA Area:     16.40 cm LA Vol (A2C):   64.3 ml 29.64 ml/m  RA Volume:   35.80 ml  16.50 ml/m LA Vol (A4C):   62.6 ml 28.85 ml/m LA Biplane Vol: 68.7 ml 31.66 ml/m AORTIC VALVE LVOT Vmax:   105.00 cm/s LVOT Vmean:  69.100 cm/s LVOT VTI:    0.254 m  AORTA Ao Root diam: 3.10 cm Ao Asc diam:  3.10 cm  MITRAL VALVE                TRICUSPID VALVE MV Area (PHT): 3.21 cm     TR Peak grad:   25.4 mmHg MV Decel Time: 236 msec     TR Vmax:        252.00 cm/s MV E velocity: 122.00 cm/s MV A velocity: 167.00 cm/s  SHUNTS MV E/A ratio:  0.73         Systemic VTI:  0.25 m Systemic Diam: 1.80 cm  Shelda Bruckner MD Electronically signed by Shelda Bruckner MD Signature Date/Time: 03/05/2022/4:29:19 PM    Final          ______________________________________________________________________________________________      Results Labs Troponin (12/2023): 60, 200, 400, 200 Magnesium  (12/2023): Low Potassium (12/2023): Low  Radiology CT coronary arteries (12/2023): Severe coronary artery calcification  Diagnostic Echocardiogram (2024): Ejection fraction 60-65%, within normal limits Echocardiogram (12/2023): Ejection fraction 40-45%, mildly reduced cardiac function  Risk Assessment/Calculations:     CHA2DS2-VASc Score = 4   This indicates a 4.8% annual risk of stroke. The  patient's score is based upon: CHF History: 1 HTN History: 1 Diabetes History: 0 Stroke History: 0 Vascular Disease History: 0 Age Score: 1 Gender Score: 1          Physical Exam:    VS:  BP (!) 140/60 (BP Location: Left Arm, Patient Position: Sitting, Cuff Size: Large)   Pulse (!) 53   Ht 5' 7  (1.702 m)   Wt 229 lb 6.4 oz (104.1 kg)   SpO2 99%   BMI 35.93 kg/m      Wt Readings from Last 3 Encounters:  01/27/24 225 lb 9.6 oz (102.3 kg)  03/06/22 235 lb 14.3 oz (107 kg)  08/17/21 236 lb (107 kg)     GEN: Well nourished, well developed, in no acute distress NECK: No JVD; No carotid bruits CARDIAC: RRR, no murmurs, rubs, gallops RESPIRATORY:  Clear to auscultation without rales, wheezing or rhonchi  ABDOMEN: Soft, non-tender, non-distended, normal bowel sounds EXTREMITIES:  Warm and well perfused, no edema; No deformity, 2+ radial pulses PSYCH: Normal mood and affect   ASSESSMENT AND PLAN: .    #Newly Diagnosed PAF - Patient recently diagnosed with new onset atrial fibrillation with RVR now in NSR. - CHA2DS2-VASc score = 4 which necessitates long-term anticoagulation. - She was seen by atrial fibrillation clinic and plans to pursue catheter ablation with the EP. - No changes today. Continue Eliquis  5 mg twice daily Continue metoprolol  succinate 100 mg daily Continue follow-up with the EP  #New HFmrEF -Noted to have mildly reduced LVEF which is likely tachycardia induced. -Nonetheless, the patient was found to have significant coronary calcifications on CT and elevated troponins, thus an ischemic cardiomyopathy is not ruled out. -She is on pretty good GDMT already for CHF, but I will switch her lisinopril  to Entresto  to improve her blood pressure control and optimize her GDMT. -I instructed the patient to stop taking her lisinopril  over the weekend ~48 hrs and then start Entresto  on Monday. -Furthermore, we will plan to get an echocardiogram in March to reassess her cardiac function. Switch lisinopril  to Entresto  medium strength Continue metoprolol  succinate 100 mg daily Continue dapagliflozin 10 mg daily Continue spironolactone  25 mg daily Continue Lasix 40 mg daily Complete echocardiogram in 2 months Follow-up in 2 months after echocardiogram   #NSTEMI #Severe  coronary Calcifications -The patient was noted to have elevated troponins at the outside hospital that were uptrending. -The patient denies having had any chest pain but did have significant SOB. -Her ECG demonstrates a possible prior inferior infarct and CT imaging demonstrated severe coronary calcifications. -Given that the patient suffered an NSTEMI (which is not definitively a type II NSTEMI per my chart review), has new CHF, and has multiple risk factors for CAD including strong family history where her father had an MI in his 35s, I think she warrants a direct ischemic evaluation with left heart cath. -The patient is agreeable to proceed. LHC/RHC Hold Eliquis  48 hours prior to procedure Hold Entresto  and Lasix 24 hours prior to procedure Start Crestor  20 mg daily Check lipoprotein a Plan for repeat lipid panel in 3 months   #HTN - Her blood pressure remains elevated despite being started on multiple new blood pressure medications. - I will switch her from lisinopril  to Entresto  to see if that confers additional blood pressure control. - Additionally, she was found to be very hypokalemic during her hospitalization and prior electrolyte panel also showed hypokalemia. - I question if the patient has Conn syndrome given her  resistant hypertension. - I know that she is already on an MRA but I will go ahead and check a renin aldosterone level to see. Switching to Entresto  as described above Treatment of hypertension via treatment of heart failure as described above Check renin and aldosterone level Check BMP   #HLD -Will check LPA and lipid panel in 3 months Start Crestor  20 mg daily    Informed Consent   Shared Decision Making/Informed Consent The risks [stroke (1 in 1000), death (1 in 1000), kidney failure [usually temporary] (1 in 500), bleeding (1 in 200), allergic reaction [possibly serious] (1 in 200)], benefits (diagnostic support and management of coronary artery disease) and  alternatives of a cardiac catheterization were discussed in detail with Ms. Mudrick and she is willing to proceed.      This note was written with the assistance of a dictation microphone or AI dictation software. Please excuse any typos or grammatical errors.   Signed, Georganna Archer, MD  02/07/2024 2:42 PM    [facility] "

## 2024-02-07 NOTE — Patient Instructions (Addendum)
 Medication Instructions:  STOP Lisinopril    START ON 02/10/2024 (48 hours after stopping Lisinopril ) Entresto  49-51 mg twice daily   START Crestor  20 mg daily   *If you need a refill on your cardiac medications before your next appointment, please call your pharmacy*  Lab Work: CBC BMP MAGNESIUM  LP(a) Renin and aldosterone   If you have labs (blood work) drawn today and your tests are completely normal, you will receive your results only by: MyChart Message (if you have MyChart) OR A paper copy in the mail If you have any lab test that is abnormal or we need to change your treatment, we will call you to review the results.  Testing/Procedures: Echocardiogram in March 2026  Your physician has requested that you have an echocardiogram. Echocardiography is a painless test that uses sound waves to create images of your heart. It provides your doctor with information about the size and shape of your heart and how well your hearts chambers and valves are working. This procedure takes approximately one hour. There are no restrictions for this procedure. Please do NOT wear cologne, perfume, aftershave, or lotions (deodorant is allowed). Please arrive 15 minutes prior to your appointment time.  Please note: We ask at that you not bring children with you during ultrasound (echo/ vascular) testing. Due to room size and safety concerns, children are not allowed in the ultrasound rooms during exams. Our front office staff cannot provide observation of children in our lobby area while testing is being conducted. An adult accompanying a patient to their appointment will only be allowed in the ultrasound room at the discretion of the ultrasound technician under special circumstances. We apologize for any inconvenience.  Left and Right Heart Cath with Dr. Elmira  Your physician has requested that you have a cardiac catheterization. Cardiac catheterization is used to diagnose and/or treat various  heart conditions. Doctors may recommend this procedure for a number of different reasons. The most common reason is to evaluate chest pain. Chest pain can be a symptom of coronary artery disease (CAD), and cardiac catheterization can show whether plaque is narrowing or blocking your hearts arteries. This procedure is also used to evaluate the valves, as well as measure the blood flow and oxygen levels in different parts of your heart.  Please follow instruction sheet, as given.   Follow-Up: At [facility], you and your health needs are our priority.  As part of our continuing mission to provide you with exceptional heart care, our providers are all part of one team.  This team includes your primary Cardiologist (physician) and Advanced Practice Providers or APPs (Physician Assistants and Nurse Practitioners) who all work together to provide you with the care you need, when you need it.  Your next appointment:   2 week(s) post cath   Provider:   One of our Advanced Practice Providers (APPs): Morse Clause, PA-C  Lamarr Satterfield, NP Miriam Shams, NP  Olivia Pavy, PA-C Josefa Beauvais, NP  Leontine Salen, PA-C Orren Fabry, PA-C  [place], PA-C Ernest Dick, NP  Damien Braver, NP Jon Hails, PA-C  Waddell Donath, PA-C    Dayna Dunn, PA-C  Scott Weaver, PA-C Lum Louis, NP Katlyn West, NP Callie Goodrich, PA-C  Xika Zhao, NP Sheng Haley, PA-C    Kathleen Johnson, PA-C   Then, Georganna Archer, MD will plan to see you again in 2 month(s) after echocardiogram.    We recommend signing up for the patient portal called MyChart.  Sign up information  is provided on this After Visit Summary.  MyChart is used to connect with patients for Virtual Visits (Telemedicine).  Patients are able to view lab/test results, encounter notes, upcoming appointments, etc.  Non-urgent messages can be sent to your provider as well.   To learn more about what you can do with MyChart, go to  [email or web address].    Other Instructions   [facility] A DEPT OF [place]. [facility] [address] A DEPT OF THE [place]. [facility] [address] [place] [address] Dept: [phone] Loc: [phone]  XELA OREGEL  02/07/2024  You are scheduled for a right and Left Cardiac Catheterization on Wednesday, January 21 with Dr. Newman Lawrence.  1. Please arrive at the [facility] (Main Entrance A) at [facility]: [address] [place], [place] [address] at 6:30 AM (This time is 2 hour(s) before your procedure to ensure your preparation).   Free valet parking service is available. You will check in at ADMITTING. The support person will be asked to wait in the waiting room.  It is OK to have someone drop you off and come back when you are ready to be discharged.    Special note: Every effort is made to have your procedure done on time. Please understand that emergencies sometimes delay scheduled procedures.  2. Diet: Nothing to eat after midnight.   3. Hydration: You need to be well hydrated before your procedure. On January 21, you may drink approved liquids (see below) until 2 hours before the procedure, with 16 oz of water  as your last intake.   List of approved liquids water , clear juice, clear tea, black coffee, fruit juices, non-citric and without pulp, carbonated beverages, Gatorade, Kool -Aid, plain Jello-O and plain ice popsicles.  4. Labs: You will need to have blood drawn on  today  if possible if not nn Monday,  at [facility] (1st Floor), [address] [place], [place] [address]. You do not need to be fasting.  5. Medication instructions in preparation for your procedure:   Contrast Allergy: No   Stop taking Eliquis  (Apixiban) on Sunday, January 18. ( Last dose is Sunday )  Stop taking, Lasix (Furosemide)  40 mg  and Spironolactone   25 mg the morning of the  procedure  Stop taking  Entresto  on Monday  Jan 19 ( last dose on Monday )  Stop taking Farxiga  10 mg Saturday , January 17,( last day will be Saturday )     On the morning of your procedure, take your Aspirin  81 mg and any morning medicines NOT listed above.  You may use sips of water .  6. Plan to go home the same day, you will only stay overnight if medically necessary. 7. Bring a current list of your medications and current insurance cards. 8. You MUST have a responsible person to drive you home. 9. Someone MUST be with you the first 24 hours after you arrive home or your discharge will be delayed. 10. Please wear clothes that are easy to get on and off and wear slip-on shoes.  Thank you for allowing us  to care for you!   -- [facility]

## 2024-02-07 NOTE — H&P (View-Only) (Signed)
 " Cardiology Office Note:  .   Date:  02/07/2024  ID:  CAILYNN BODNAR, DOB Apr 17, 1955, MRN [id number] PCP: Shepard Ade, MD  [facility] Providers Cardiologist:  Georganna Archer, MD { Chief Complaint:  Chief Complaint  Patient presents with   Atrial Fibrillation    History of Present Illness: .    Casey Larsen is a 70 y.o. female with a PMH of HFmrEF, HTN, HLD, new onset PAF and obesity who presents as a new patient referral for the evaluation of new onset atrial fibrillation.  Discussed the use of AI scribe software for clinical note transcription with the patient, who gave verbal consent to proceed.  History of Present Illness Casey Larsen is a 69 year old female with atrial fibrillation who presents for follow-up after hospitalization.  The patient was recently diagnosed with atrial fibrillation on 01/01/2024 at presents the ED with shortness of breath.  She was found to have multiple electrolyte derangements including hypomagnesemia and hypokalemia.  She was found to be in new onset atrial fibrillation with RVR which converted spontaneously to NSR.  Her troponins were elevated 63 -> 270 -> 329 -> 418.  She did not have any chest pain but just shortness of breath. Over the past couple of months, she occasionally felt exhausted after getting up at night to use the bathroom and noticed her heart beating fast. These episodes resolved after she went back to sleep until one instance where she felt she couldn't breathe, prompting hospital admission. Since discharge, she initially felt lightheaded when standing, which she attributed to medication, but now feels much better.  Workup in the ED was notable for pulmonary edema with severe coronary calcifications.  Echocardiogram revealed newly reduced LVEF to 40 to 45%.  Her current medications include Eliquis  5 mg twice a day, Farxiga  10 mg once a day, Lasix 40 mg once a day, Lisinopril  40 mg once a day, Metoprolol  succinate 100  mg once a day, and Spironolactone  25 mg once a day. She experienced lightheadedness when standing after discharge, which she thought was due to her medication.  She experienced leg swelling about a month before the atrial fibrillation episode, which resolved after wearing compression stockings for a week. She has not had any swelling since then.  She has a family history of heart disease; her father died of a heart attack at 26 and had diabetes and congestive heart failure. Her sister and brother have high blood pressure. She quit smoking over 15 years ago and denies any illicit drug use.  She has a history of high blood pressure, diagnosed seven years ago. She recalls her blood pressure readings often being high, but she was not concerned as it seemed normal for her. She has been on blood pressure medication since then.  She mentions experiencing cramps in her legs occasionally, which are sore afterwards. During her hospital stay, her magnesium  and potassium levels were noted to be low.      Studies Reviewed: SABRA    EKG:  EKG Interpretation Date/Time:  Friday February 07 2024 15:00:57 EST Ventricular Rate:  53 PR Interval:  196 QRS Duration:  76 QT Interval:  420 QTC Calculation: 394 R Axis:   -1  Text Interpretation: Sinus bradycardia Minimal voltage criteria for LVH, may be normal variant ( R in aVL ) Possible Inferior infarct (cited on or before 15-Jul-2018) When compared with ECG of 27-Jan-2024 14:39, No significant change was found Confirmed by Archer Georganna [phone]) on 02/07/2024 3:03:12  PM     Cardiac Studies & Procedures   ______________________________________________________________________________________________     ECHOCARDIOGRAM  ECHOCARDIOGRAM COMPLETE 03/05/2022  Narrative ECHOCARDIOGRAM REPORT    [name] Date of Exam: 03/05/2022 Medical Rec #:  [id number]        Height:       67.0 in Accession #:    [id number]       Weight:        236.0 lb Date of Birth:  01-11-1956         BSA:          2.170 m Patient Age:    66 years         BP:           156/75 mmHg Patient Gender: F                HR:           78 bpm. Exam Location:  Inpatient  Procedure: 2D Echo, Cardiac Doppler and Color Doppler  Indications:    R94.31 Abnormal EKG, Dyspnea  History:        Patient has no prior history of Echocardiogram examinations. Risk Factors:Dyslipidemia and Hypertension.  Sonographer:    Morna Luis Referring Phys: [id number] DAVID MANUEL ORTIZ  IMPRESSIONS   1. Left ventricular ejection fraction, by estimation, is 60 to 65%. The left ventricle has normal function. The left ventricle has no regional wall motion abnormalities. Left ventricular diastolic parameters are indeterminate. 2. Right ventricular systolic function is normal. The right ventricular size is normal. There is normal pulmonary artery systolic pressure. 3. Left atrial size was mildly dilated. 4. The mitral valve is normal in structure. Trivial mitral valve regurgitation. No evidence of mitral stenosis. 5. The aortic valve is tricuspid. There is mild calcification of the aortic valve. Aortic valve regurgitation is not visualized. No aortic stenosis is present. 6. The inferior vena cava is dilated in size with >50% respiratory variability, suggesting right atrial pressure of 8 mmHg.  Comparison(s): No prior Echocardiogram.  Conclusion(s)/Recommendation(s): Normal biventricular function without evidence of hemodynamically significant valvular heart disease.  FINDINGS Left Ventricle: Left ventricular ejection fraction, by estimation, is 60 to 65%. The left ventricle has normal function. The left ventricle has no regional wall motion abnormalities. The left ventricular internal cavity size was normal in size. There is no left ventricular hypertrophy. Left ventricular diastolic parameters are indeterminate.  Right Ventricle: The right ventricular size is normal. No  increase in right ventricular wall thickness. Right ventricular systolic function is normal. There is normal pulmonary artery systolic pressure. The tricuspid regurgitant velocity is 2.52 m/s, and with an assumed right atrial pressure of 8 mmHg, the estimated right ventricular systolic pressure is 33.4 mmHg.  Left Atrium: Left atrial size was mildly dilated.  Right Atrium: Right atrial size was normal in size.  Pericardium: There is no evidence of pericardial effusion.  Mitral Valve: The mitral valve is normal in structure. Trivial mitral valve regurgitation. No evidence of mitral valve stenosis.  Tricuspid Valve: The tricuspid valve is normal in structure. Tricuspid valve regurgitation is trivial. No evidence of tricuspid stenosis.  Aortic Valve: The aortic valve is tricuspid. There is mild calcification of the aortic valve. Aortic valve regurgitation is not visualized. No aortic stenosis is present.  Pulmonic Valve: The pulmonic valve was grossly normal. Pulmonic valve regurgitation is not visualized. No evidence of pulmonic stenosis.  Aorta: The aortic root, ascending aorta, aortic arch and descending aorta are  all structurally normal, with no evidence of dilitation or obstruction.  Venous: The inferior vena cava is dilated in size with greater than 50% respiratory variability, suggesting right atrial pressure of 8 mmHg.  IAS/Shunts: No atrial level shunt detected by color flow Doppler.   LEFT VENTRICLE PLAX 2D LVIDd:         4.20 cm      Diastology LVIDs:         2.70 cm      LV e' medial:    6.31 cm/s LV PW:         0.80 cm      LV E/e' medial:  19.3 LV IVS:        0.80 cm      LV e' lateral:   5.71 cm/s LVOT diam:     1.80 cm      LV E/e' lateral: 21.4 LV SV:         65 LV SV Index:   30 LVOT Area:     2.54 cm  LV Volumes (MOD) LV vol d, MOD A2C: 128.0 ml LV vol d, MOD A4C: 127.0 ml LV vol s, MOD A2C: 51.0 ml LV vol s, MOD A4C: 44.2 ml LV SV MOD A2C:     77.0 ml LV SV  MOD A4C:     127.0 ml LV SV MOD BP:      79.6 ml  RIGHT VENTRICLE             IVC RV Basal diam:  3.40 cm     IVC diam: 2.20 cm RV S prime:     17.20 cm/s TAPSE (M-mode): 2.2 cm  LEFT ATRIUM             Index        RIGHT ATRIUM           Index LA diam:        4.10 cm 1.89 cm/m   RA Area:     16.40 cm LA Vol (A2C):   64.3 ml 29.64 ml/m  RA Volume:   35.80 ml  16.50 ml/m LA Vol (A4C):   62.6 ml 28.85 ml/m LA Biplane Vol: 68.7 ml 31.66 ml/m AORTIC VALVE LVOT Vmax:   105.00 cm/s LVOT Vmean:  69.100 cm/s LVOT VTI:    0.254 m  AORTA Ao Root diam: 3.10 cm Ao Asc diam:  3.10 cm  MITRAL VALVE                TRICUSPID VALVE MV Area (PHT): 3.21 cm     TR Peak grad:   25.4 mmHg MV Decel Time: 236 msec     TR Vmax:        252.00 cm/s MV E velocity: 122.00 cm/s MV A velocity: 167.00 cm/s  SHUNTS MV E/A ratio:  0.73         Systemic VTI:  0.25 m Systemic Diam: 1.80 cm  Shelda Bruckner MD Electronically signed by Shelda Bruckner MD Signature Date/Time: 03/05/2022/4:29:19 PM    Final          ______________________________________________________________________________________________      Results Labs Troponin (12/2023): 60, 200, 400, 200 Magnesium  (12/2023): Low Potassium (12/2023): Low  Radiology CT coronary arteries (12/2023): Severe coronary artery calcification  Diagnostic Echocardiogram (2024): Ejection fraction 60-65%, within normal limits Echocardiogram (12/2023): Ejection fraction 40-45%, mildly reduced cardiac function  Risk Assessment/Calculations:     CHA2DS2-VASc Score = 4   This indicates a 4.8% annual risk of stroke. The  patient's score is based upon: CHF History: 1 HTN History: 1 Diabetes History: 0 Stroke History: 0 Vascular Disease History: 0 Age Score: 1 Gender Score: 1          Physical Exam:    VS:  BP (!) 140/60 (BP Location: Left Arm, Patient Position: Sitting, Cuff Size: Large)   Pulse (!) 53   Ht 5' 7  (1.702 m)   Wt 229 lb 6.4 oz (104.1 kg)   SpO2 99%   BMI 35.93 kg/m      Wt Readings from Last 3 Encounters:  01/27/24 225 lb 9.6 oz (102.3 kg)  03/06/22 235 lb 14.3 oz (107 kg)  08/17/21 236 lb (107 kg)     GEN: Well nourished, well developed, in no acute distress NECK: No JVD; No carotid bruits CARDIAC: RRR, no murmurs, rubs, gallops RESPIRATORY:  Clear to auscultation without rales, wheezing or rhonchi  ABDOMEN: Soft, non-tender, non-distended, normal bowel sounds EXTREMITIES:  Warm and well perfused, no edema; No deformity, 2+ radial pulses PSYCH: Normal mood and affect   ASSESSMENT AND PLAN: .    #Newly Diagnosed PAF - Patient recently diagnosed with new onset atrial fibrillation with RVR now in NSR. - CHA2DS2-VASc score = 4 which necessitates long-term anticoagulation. - She was seen by atrial fibrillation clinic and plans to pursue catheter ablation with the EP. - No changes today. Continue Eliquis  5 mg twice daily Continue metoprolol  succinate 100 mg daily Continue follow-up with the EP  #New HFmrEF -Noted to have mildly reduced LVEF which is likely tachycardia induced. -Nonetheless, the patient was found to have significant coronary calcifications on CT and elevated troponins, thus an ischemic cardiomyopathy is not ruled out. -She is on pretty good GDMT already for CHF, but I will switch her lisinopril  to Entresto  to improve her blood pressure control and optimize her GDMT. -I instructed the patient to stop taking her lisinopril  over the weekend ~48 hrs and then start Entresto  on Monday. -Furthermore, we will plan to get an echocardiogram in March to reassess her cardiac function. Switch lisinopril  to Entresto  medium strength Continue metoprolol  succinate 100 mg daily Continue dapagliflozin 10 mg daily Continue spironolactone  25 mg daily Continue Lasix 40 mg daily Complete echocardiogram in 2 months Follow-up in 2 months after echocardiogram   #NSTEMI #Severe  coronary Calcifications -The patient was noted to have elevated troponins at the outside hospital that were uptrending. -The patient denies having had any chest pain but did have significant SOB. -Her ECG demonstrates a possible prior inferior infarct and CT imaging demonstrated severe coronary calcifications. -Given that the patient suffered an NSTEMI (which is not definitively a type II NSTEMI per my chart review), has new CHF, and has multiple risk factors for CAD including strong family history where her father had an MI in his 35s, I think she warrants a direct ischemic evaluation with left heart cath. -The patient is agreeable to proceed. LHC/RHC Hold Eliquis  48 hours prior to procedure Hold Entresto  and Lasix 24 hours prior to procedure Start Crestor  20 mg daily Check lipoprotein a Plan for repeat lipid panel in 3 months   #HTN - Her blood pressure remains elevated despite being started on multiple new blood pressure medications. - I will switch her from lisinopril  to Entresto  to see if that confers additional blood pressure control. - Additionally, she was found to be very hypokalemic during her hospitalization and prior electrolyte panel also showed hypokalemia. - I question if the patient has Conn syndrome given her  resistant hypertension. - I know that she is already on an MRA but I will go ahead and check a renin aldosterone level to see. Switching to Entresto  as described above Treatment of hypertension via treatment of heart failure as described above Check renin and aldosterone level Check BMP   #HLD -Will check LPA and lipid panel in 3 months Start Crestor  20 mg daily    Informed Consent   Shared Decision Making/Informed Consent The risks [stroke (1 in 1000), death (1 in 1000), kidney failure [usually temporary] (1 in 500), bleeding (1 in 200), allergic reaction [possibly serious] (1 in 200)], benefits (diagnostic support and management of coronary artery disease) and  alternatives of a cardiac catheterization were discussed in detail with Ms. Mudrick and she is willing to proceed.      This note was written with the assistance of a dictation microphone or AI dictation software. Please excuse any typos or grammatical errors.   Signed, Georganna Archer, MD  02/07/2024 2:42 PM    [facility] "

## 2024-02-10 ENCOUNTER — Telehealth: Payer: Self-pay | Admitting: *Deleted

## 2024-02-10 ENCOUNTER — Other Ambulatory Visit (HOSPITAL_COMMUNITY): Payer: Self-pay

## 2024-02-10 NOTE — Telephone Encounter (Signed)
 Cardiac Catheterization scheduled at [facility] for: Wednesday February 12, 2024 8:30 AM Arrival time [facility] Main Entrance A at: 6:30 AM  Diet: -Nothing to eat after midnight.  Hydration: -May drink clear liquids until 2 hours before the procedure.  Approved liquids: Water , clear tea, black coffee, fruit juices-non-citric and without pulp,Gatorade, plain Jello/popsicles.   -Please drink 16 oz of water  2 hours before procedure.  Medication instructions: -Hold:  Eliquis -none 02/10/24 until post procedure  Farxiga /Lasix -Other usual morning medications can be taken including aspirin  81 mg.  Plan to go home the same day, you will only stay overnight if medically necessary.  You must have responsible adult to drive you home.  Someone must be with you the first 24 hours after you arrive home.

## 2024-02-10 NOTE — Telephone Encounter (Signed)
 Reviewed procedure instructions with patient.

## 2024-02-11 ENCOUNTER — Telehealth: Payer: Self-pay | Admitting: Student in an Organized Health Care Education/Training Program

## 2024-02-11 ENCOUNTER — Telehealth: Payer: Self-pay

## 2024-02-11 DIAGNOSIS — R7989 Other specified abnormal findings of blood chemistry: Secondary | ICD-10-CM

## 2024-02-11 NOTE — Telephone Encounter (Signed)
 Per Dr Floretta copied from 02/11/24 Staff Message: Her creatinine is much higher than it was even 1 month ago. I think the best thing to do would be to hold off on the cath for now and we will have to reschedule. I will call the patient to discuss  Cardiac Cath scheduled 02/12/24 has been cancelled, will not reschedule at this time per Dr Floretta.

## 2024-02-11 NOTE — Telephone Encounter (Signed)
 I called the patient to discuss her lab results.  Her creatinine has almost doubled since it was last checked a couple weeks ago.  I suspect that she may be intravascularly dry given her Lasix and she has also been reducing her fluid intake.  I instructed her to discontinue her Lasix at this time and not to withhold p.o. water  intake if she is thirsty.  I will cancel her left heart catheterization for tomorrow and tentatively plan for her to undergo heart catheterization in 2 weeks after she gets her creatinine rechecked.  I will order a follow-up BMP to be collected next Wednesday.  The patient is in agreement with this plan.  Lacye Mccarn T. Floretta HEATH, MD [place]  [facility]  02/11/2024 12:04 PM

## 2024-02-11 NOTE — Telephone Encounter (Signed)
BMP order placed and released

## 2024-02-12 ENCOUNTER — Encounter (HOSPITAL_COMMUNITY): Admission: RE | Payer: Self-pay | Source: Home / Self Care

## 2024-02-12 ENCOUNTER — Ambulatory Visit (HOSPITAL_COMMUNITY): Admission: RE | Admit: 2024-02-12 | Source: Home / Self Care | Admitting: Cardiology

## 2024-02-12 SURGERY — RIGHT/LEFT HEART CATH AND CORONARY ANGIOGRAPHY
Anesthesia: LOCAL

## 2024-02-13 ENCOUNTER — Telehealth: Payer: Self-pay

## 2024-02-13 NOTE — Telephone Encounter (Signed)
 Left message for patient to cal back.

## 2024-02-13 NOTE — Telephone Encounter (Signed)
-----   Message from Georganna Archer, MD sent at 02/11/2024  7:30 PM EST ----- Regarding: RE: Cath Reschedule Walterine Bottcher,  I think it's fine to go ahead and reschedule for one of the dates that I mentioned. I am confident her renal function will improve by then. Same instructions as last time!  Thanks,  Lonnie ----- Message ----- From: Manda Bottcher NOVAK, RN Sent: 02/11/2024  12:19 PM EST To: Georganna Archer, MD Subject: RE: Cath Reschedule                            Lab order placed and do you want me to hold off on rescheduling cath until resulted? Also are we going to keep the same medication instructions? ----- Message ----- From: Archer Georganna, MD Sent: 02/11/2024  12:09 PM EST To: Bottcher NOVAK Manda, RN Subject: Cath Reschedule                                Hi Jahna Liebert,  I had to cancel her cath for tomorrow because she has an AKI. I called the patient and the plan is to have her hold her lasix and get a repeat BMP on Wednesday 02/19/24. I would like her to be re-scheduled for a LHC/RHC on either Friday 02/21/24 or Monday 02/24/24. Are you able to call her coordinate either of these cath dates? Can you order the repeat BMP for 1/28?  Please let me know if you have questions.  Lonnie

## 2024-02-14 LAB — BASIC METABOLIC PANEL WITH GFR
BUN/Creatinine Ratio: 31 — ABNORMAL HIGH (ref 12–28)
BUN: 45 mg/dL — ABNORMAL HIGH (ref 8–27)
CO2: 17 mmol/L — ABNORMAL LOW (ref 20–29)
Calcium: 10 mg/dL (ref 8.7–10.3)
Chloride: 104 mmol/L (ref 96–106)
Creatinine, Ser: 1.45 mg/dL — ABNORMAL HIGH (ref 0.57–1.00)
Glucose: 94 mg/dL (ref 70–99)
Potassium: 4 mmol/L (ref 3.5–5.2)
Sodium: 142 mmol/L (ref 134–144)
eGFR: 39 mL/min/1.73 — ABNORMAL LOW

## 2024-02-14 LAB — MAGNESIUM: Magnesium: 1.8 mg/dL (ref 1.6–2.3)

## 2024-02-14 LAB — CBC
Hematocrit: 39.2 % (ref 34.0–46.6)
Hemoglobin: 12.5 g/dL (ref 11.1–15.9)
MCH: 28.7 pg (ref 26.6–33.0)
MCHC: 31.9 g/dL (ref 31.5–35.7)
MCV: 90 fL (ref 79–97)
Platelets: 313 x10E3/uL (ref 150–450)
RBC: 4.35 x10E6/uL (ref 3.77–5.28)
RDW: 13.1 % (ref 11.7–15.4)
WBC: 8.4 x10E3/uL (ref 3.4–10.8)

## 2024-02-14 LAB — ALDOSTERONE + RENIN ACTIVITY W/ RATIO
Aldos/Renin Ratio: 0.4 (ref 0.0–20.0)
Aldosterone: 6.6 ng/dL (ref 0.0–30.0)
Renin Activity, Plasma: 17.091 ng/mL/h — AB (ref 0.167–5.380)

## 2024-02-14 LAB — LIPOPROTEIN A (LPA): Lipoprotein (a): 14.3 nmol/L

## 2024-02-14 NOTE — Telephone Encounter (Signed)
 Called and spoke to pt. Rescheduled LT/RT heart Cath for 02/21/24 arrival time 05:30 am with Dr. Anner.   Reviewed med instructions in detail (same as last OV/AVS). She will have labs (BMET) checked on 02/19/24 at [place] location (she works in [place]).  She is holding Lasix.

## 2024-02-14 NOTE — Telephone Encounter (Signed)
 Please review

## 2024-02-20 ENCOUNTER — Telehealth: Payer: Self-pay | Admitting: *Deleted

## 2024-02-20 LAB — BASIC METABOLIC PANEL WITH GFR
BUN/Creatinine Ratio: 19 (ref 12–28)
BUN: 15 mg/dL (ref 8–27)
CO2: 23 mmol/L (ref 20–29)
Calcium: 9 mg/dL (ref 8.7–10.3)
Chloride: 108 mmol/L — ABNORMAL HIGH (ref 96–106)
Creatinine, Ser: 0.79 mg/dL (ref 0.57–1.00)
Glucose: 88 mg/dL (ref 70–99)
Potassium: 3.9 mmol/L (ref 3.5–5.2)
Sodium: 144 mmol/L (ref 134–144)
eGFR: 81 mL/min/{1.73_m2}

## 2024-02-20 NOTE — Telephone Encounter (Addendum)
 Cardiac Catheterization scheduled at [facility] for: Friday January 30,2026 7:30 AM Arrival time [facility] Main Entrance A at: 5:30 AM  Diet: -Nothing to eat after midnight.  Hydration: -May drink clear liquids until 2 hours before the procedure.  Approved liquids: Water , clear tea, black coffee, fruit juices-non-citric and without pulp,Gatorade, plain Jello/popsicles.   -Please drink 16 oz of water  2 hours before procedure.  Medication instructions: -Hold:  Eliquis -none 02/18/24 until post procedure   Spironolactone /Farxiga -AM of procedure   Lasix is on hold. -Other usual morning medications can be taken including aspirin  81 mg.  Plan to go home the same day, you will only stay overnight if medically necessary.  You must have responsible adult to drive you home.  Someone must be with you the first 24 hours after you arrive home.  Reviewed procedure instructions with patient.

## 2024-02-20 NOTE — Telephone Encounter (Signed)
 error

## 2024-02-21 ENCOUNTER — Ambulatory Visit (HOSPITAL_COMMUNITY)
Admission: RE | Admit: 2024-02-21 | Discharge: 2024-02-21 | Disposition: A | Discharge status: Home / Self Care | Attending: Cardiology | Admitting: Cardiology

## 2024-02-21 ENCOUNTER — Encounter (HOSPITAL_COMMUNITY)
Admission: RE | Disposition: A | Discharge status: Home / Self Care | Payer: Self-pay | Source: Home / Self Care | Attending: Cardiology

## 2024-02-21 ENCOUNTER — Other Ambulatory Visit: Payer: Self-pay

## 2024-02-21 ENCOUNTER — Ambulatory Visit: Payer: Self-pay | Admitting: Student in an Organized Health Care Education/Training Program

## 2024-02-21 DIAGNOSIS — E669 Obesity, unspecified: Secondary | ICD-10-CM | POA: Insufficient documentation

## 2024-02-21 DIAGNOSIS — I5023 Acute on chronic systolic (congestive) heart failure: Secondary | ICD-10-CM | POA: Insufficient documentation

## 2024-02-21 DIAGNOSIS — I110 Hypertensive heart disease with heart failure: Secondary | ICD-10-CM | POA: Diagnosis not present

## 2024-02-21 DIAGNOSIS — Z7901 Long term (current) use of anticoagulants: Secondary | ICD-10-CM | POA: Diagnosis not present

## 2024-02-21 DIAGNOSIS — Z79899 Other long term (current) drug therapy: Secondary | ICD-10-CM | POA: Insufficient documentation

## 2024-02-21 DIAGNOSIS — Z8249 Family history of ischemic heart disease and other diseases of the circulatory system: Secondary | ICD-10-CM | POA: Insufficient documentation

## 2024-02-21 DIAGNOSIS — Z6835 Body mass index (BMI) 35.0-35.9, adult: Secondary | ICD-10-CM | POA: Diagnosis not present

## 2024-02-21 DIAGNOSIS — R7989 Other specified abnormal findings of blood chemistry: Secondary | ICD-10-CM | POA: Insufficient documentation

## 2024-02-21 DIAGNOSIS — I2510 Atherosclerotic heart disease of native coronary artery without angina pectoris: Secondary | ICD-10-CM | POA: Insufficient documentation

## 2024-02-21 DIAGNOSIS — I252 Old myocardial infarction: Secondary | ICD-10-CM | POA: Insufficient documentation

## 2024-02-21 DIAGNOSIS — I5021 Acute systolic (congestive) heart failure: Secondary | ICD-10-CM | POA: Diagnosis not present

## 2024-02-21 DIAGNOSIS — E785 Hyperlipidemia, unspecified: Secondary | ICD-10-CM | POA: Insufficient documentation

## 2024-02-21 DIAGNOSIS — I480 Paroxysmal atrial fibrillation: Secondary | ICD-10-CM | POA: Insufficient documentation

## 2024-02-21 LAB — POCT I-STAT EG7
Acid-base deficit: 3 mmol/L — ABNORMAL HIGH (ref 0.0–2.0)
Acid-base deficit: 3 mmol/L — ABNORMAL HIGH (ref 0.0–2.0)
Acid-base deficit: 2 mmol/L (ref 0.0–2.0)
Acid-base deficit: 3 mmol/L — ABNORMAL HIGH (ref 0.0–2.0)
Acid-base deficit: 3 mmol/L — ABNORMAL HIGH (ref 0.0–2.0)
Acid-base deficit: 3 mmol/L — ABNORMAL HIGH (ref 0.0–2.0)
Bicarbonate: 21.5 mmol/L (ref 20.0–28.0)
Bicarbonate: 22.2 mmol/L (ref 20.0–28.0)
Bicarbonate: 21.6 mmol/L (ref 20.0–28.0)
Bicarbonate: 21.6 mmol/L (ref 20.0–28.0)
Bicarbonate: 22 mmol/L (ref 20.0–28.0)
Bicarbonate: 22 mmol/L (ref 20.0–28.0)
Calcium, Ion: 1.17 mmol/L (ref 1.15–1.40)
Calcium, Ion: 1.21 mmol/L (ref 1.15–1.40)
Calcium, Ion: 1.2 mmol/L (ref 1.15–1.40)
Calcium, Ion: 1.2 mmol/L (ref 1.15–1.40)
Calcium, Ion: 1.21 mmol/L (ref 1.15–1.40)
Calcium, Ion: 1.21 mmol/L (ref 1.15–1.40)
HCT: 30 % — ABNORMAL LOW (ref 36.0–46.0)
HCT: 31 % — ABNORMAL LOW (ref 36.0–46.0)
HCT: 30 % — ABNORMAL LOW (ref 36.0–46.0)
HCT: 30 % — ABNORMAL LOW (ref 36.0–46.0)
HCT: 30 % — ABNORMAL LOW (ref 36.0–46.0)
HCT: 31 % — ABNORMAL LOW (ref 36.0–46.0)
Hemoglobin: 10.2 g/dL — ABNORMAL LOW (ref 12.0–15.0)
Hemoglobin: 10.5 g/dL — ABNORMAL LOW (ref 12.0–15.0)
Hemoglobin: 10.2 g/dL — ABNORMAL LOW (ref 12.0–15.0)
Hemoglobin: 10.2 g/dL — ABNORMAL LOW (ref 12.0–15.0)
Hemoglobin: 10.2 g/dL — ABNORMAL LOW (ref 12.0–15.0)
Hemoglobin: 10.5 g/dL — ABNORMAL LOW (ref 12.0–15.0)
O2 Saturation: 55 %
O2 Saturation: 59 %
O2 Saturation: 62 %
O2 Saturation: 63 %
O2 Saturation: 65 %
O2 Saturation: 68 %
Potassium: 3.2 mmol/L — ABNORMAL LOW (ref 3.5–5.1)
Potassium: 3.3 mmol/L — ABNORMAL LOW (ref 3.5–5.1)
Potassium: 3.2 mmol/L — ABNORMAL LOW (ref 3.5–5.1)
Potassium: 3.2 mmol/L — ABNORMAL LOW (ref 3.5–5.1)
Potassium: 3.2 mmol/L — ABNORMAL LOW (ref 3.5–5.1)
Potassium: 3.2 mmol/L — ABNORMAL LOW (ref 3.5–5.1)
Sodium: 141 mmol/L (ref 135–145)
Sodium: 143 mmol/L (ref 135–145)
Sodium: 144 mmol/L (ref 135–145)
Sodium: 144 mmol/L (ref 135–145)
Sodium: 144 mmol/L (ref 135–145)
Sodium: 144 mmol/L (ref 135–145)
TCO2: 23 mmol/L (ref 22–32)
TCO2: 23 mmol/L (ref 22–32)
TCO2: 23 mmol/L (ref 22–32)
TCO2: 23 mmol/L (ref 22–32)
TCO2: 23 mmol/L (ref 22–32)
TCO2: 23 mmol/L (ref 22–32)
pCO2, Ven: 37.4 mmHg — ABNORMAL LOW (ref 44–60)
pCO2, Ven: 37.6 mmHg — ABNORMAL LOW (ref 44–60)
pCO2, Ven: 34.7 mmHg — ABNORMAL LOW (ref 44–60)
pCO2, Ven: 35.5 mmHg — ABNORMAL LOW (ref 44–60)
pCO2, Ven: 36.1 mmHg — ABNORMAL LOW (ref 44–60)
pCO2, Ven: 36.6 mmHg — ABNORMAL LOW (ref 44–60)
pH, Ven: 7.368 (ref 7.25–7.43)
pH, Ven: 7.38 (ref 7.25–7.43)
pH, Ven: 7.385 (ref 7.25–7.43)
pH, Ven: 7.386 (ref 7.25–7.43)
pH, Ven: 7.4 (ref 7.25–7.43)
pH, Ven: 7.401 (ref 7.25–7.43)
pO2, Ven: 29 mmHg — CL (ref 32–45)
pO2, Ven: 31 mmHg — CL (ref 32–45)
pO2, Ven: 32 mmHg (ref 32–45)
pO2, Ven: 32 mmHg (ref 32–45)
pO2, Ven: 34 mmHg (ref 32–45)
pO2, Ven: 36 mmHg (ref 32–45)

## 2024-02-21 LAB — POCT I-STAT 7, (LYTES, BLD GAS, ICA,H+H)
Acid-base deficit: 4 mmol/L — ABNORMAL HIGH (ref 0.0–2.0)
Bicarbonate: 20.1 mmol/L (ref 20.0–28.0)
Calcium, Ion: 1.19 mmol/L (ref 1.15–1.40)
HCT: 30 % — ABNORMAL LOW (ref 36.0–46.0)
Hemoglobin: 10.2 g/dL — ABNORMAL LOW (ref 12.0–15.0)
O2 Saturation: 95 %
Potassium: 3.3 mmol/L — ABNORMAL LOW (ref 3.5–5.1)
Sodium: 143 mmol/L (ref 135–145)
TCO2: 21 mmol/L — ABNORMAL LOW (ref 22–32)
pCO2 arterial: 30.6 mmHg — ABNORMAL LOW (ref 32–48)
pH, Arterial: 7.426 (ref 7.35–7.45)
pO2, Arterial: 73 mmHg — ABNORMAL LOW (ref 83–108)

## 2024-02-21 MED ORDER — FUROSEMIDE 40 MG PO TABS: 40.0000 mg | ORAL_TABLET | Freq: Every day | ORAL | 3 refills | Status: AC

## 2024-02-21 MED ORDER — FENTANYL CITRATE (PF) 100 MCG/2ML IJ SOLN
INTRAMUSCULAR | Status: DC | PRN
  Administered 2024-02-21: 25 ug via INTRAVENOUS

## 2024-02-21 MED ORDER — HEPARIN SODIUM (PORCINE) 1000 UNIT/ML IJ SOLN
INTRAMUSCULAR | Status: DC | PRN
  Administered 2024-02-21: 5000 [IU] via INTRAVENOUS

## 2024-02-21 MED ORDER — SODIUM CHLORIDE 0.9 % IV SOLN: 250.0000 mL | INTRAVENOUS | Status: DC | PRN

## 2024-02-21 MED ORDER — HYDRALAZINE HCL 20 MG/ML IJ SOLN
INTRAMUSCULAR | Status: AC
  Filled 2024-02-21: qty 1

## 2024-02-21 MED ORDER — HEPARIN SODIUM (PORCINE) 1000 UNIT/ML IJ SOLN
INTRAMUSCULAR | Status: AC
  Filled 2024-02-21: qty 10

## 2024-02-21 MED ORDER — LIDOCAINE HCL (PF) 1 % IJ SOLN
INTRAMUSCULAR | Status: DC | PRN
  Administered 2024-02-21 (×3): 5 mL via INTRADERMAL

## 2024-02-21 MED ORDER — ASPIRIN 81 MG PO CHEW: 81.0000 mg | CHEWABLE_TABLET | ORAL | Status: DC

## 2024-02-21 MED ORDER — ACETAMINOPHEN 325 MG PO TABS: 650.0000 mg | ORAL_TABLET | ORAL | Status: DC | PRN

## 2024-02-21 MED ORDER — LABETALOL HCL 5 MG/ML IV SOLN: 10.0000 mg | INTRAVENOUS | Status: DC | PRN

## 2024-02-21 MED ORDER — VERAPAMIL HCL 2.5 MG/ML IV SOLN
INTRAVENOUS | Status: DC | PRN
  Administered 2024-02-21: 10 mL via INTRA_ARTERIAL

## 2024-02-21 MED ORDER — MIDAZOLAM HCL (PF) 2 MG/2ML IJ SOLN
INTRAMUSCULAR | Status: DC | PRN
  Administered 2024-02-21: 1 mg via INTRAVENOUS

## 2024-02-21 MED ORDER — ONDANSETRON HCL 4 MG/2ML IJ SOLN: 4.0000 mg | Freq: Four times a day (QID) | INTRAMUSCULAR | Status: DC | PRN

## 2024-02-21 MED ORDER — SODIUM CHLORIDE 0.9% FLUSH: 3.0000 mL | INTRAVENOUS | Status: DC | PRN

## 2024-02-21 MED ORDER — VERAPAMIL HCL 2.5 MG/ML IV SOLN
INTRAVENOUS | Status: AC
  Filled 2024-02-21: qty 2

## 2024-02-21 MED ORDER — FREE WATER: 500.0000 mL | Freq: Once | Status: DC

## 2024-02-21 MED ORDER — HYDRALAZINE HCL 20 MG/ML IJ SOLN
INTRAMUSCULAR | Status: DC | PRN
  Administered 2024-02-21: 10 mg via INTRAVENOUS

## 2024-02-21 MED ORDER — FENTANYL CITRATE (PF) 100 MCG/2ML IJ SOLN
INTRAMUSCULAR | Status: AC
  Filled 2024-02-21: qty 2

## 2024-02-21 MED ORDER — LIDOCAINE HCL (PF) 1 % IJ SOLN
INTRAMUSCULAR | Status: AC
  Filled 2024-02-21: qty 30

## 2024-02-21 MED ORDER — IOHEXOL 350 MG/ML SOLN
INTRAVENOUS | Status: DC | PRN
  Administered 2024-02-21: 45 mL

## 2024-02-21 MED ORDER — HYDRALAZINE HCL 20 MG/ML IJ SOLN: 10.0000 mg | INTRAMUSCULAR | Status: DC | PRN

## 2024-02-21 MED ORDER — SODIUM CHLORIDE 0.9% FLUSH: 3.0000 mL | Freq: Two times a day (BID) | INTRAVENOUS | Status: DC

## 2024-02-21 MED ORDER — MIDAZOLAM HCL 2 MG/2ML IJ SOLN
INTRAMUSCULAR | Status: AC
  Filled 2024-02-21: qty 2

## 2024-02-21 NOTE — Progress Notes (Signed)
Patient and sister was given discharge instructions. Both verbalized understanding. 

## 2024-02-21 NOTE — Brief Op Note (Addendum)
 "  BRIEF CARDIAC CATHETERIZATION NOTE  02/21/2024  7:30 AM  9:04 AM  ID:  Casey Larsen, DOB Nov 16, 1955, MRN [id number] PCP: Shepard Ade, MD  [facility] Providers Cardiologist:  Georganna Archer, MD  SURGEON:  Surgeons and Role:  Anner Alm ORN, MD - Primary   PROCEDURE:  Procedures: RIGHT/LEFT HEART CATH AND CORONARY ANGIOGRAPHY (N/A)   PATIENT:  Casey Larsen is a 69 y.o. female i with PMH notable for HFmrEF, HTN, HLD, new onset PAF referred by Dr. Georganna Archer for evaluation of reduced EF of 40 to 45% on recent echocardiogram in setting of elevated troponin and new onset A-fib.  She has significant coronary calcification noted on CT scan and significant family history of CAD.  She has been referred for right and left heart catheterization.  PRE-OPERATIVE DIAGNOSIS: Recent MI (non-STEMI versus type II MI); Cardiomyopathy with HFmrEF; new onset A-fib  POST-OPERATIVE DIAGNOSIS:   Angiographically minimal CAD with mild diffuse 10 to 20% stenosis and proximal calcification, but no significant stenoses.  Right dominant, but significant LCx. Systemic Hypertension-pressures of 180s/80s reduced to 150s /60s with 10 mg of hydralazine  Hypersensitive left ventricle with significant ectopy with catheter placement. Right Heart Cath Pressures performed pre and post IV hydralazine  for treatment of systemic hypertension with blood pressure Pre--hydralazine : RAP 14 mm acute, RV PF and EDP 51/4-16 Miller mercury; PAP-mean 42/23-30 mmHg.  LV PF and EDP 180/14-34 mmHg with AOP/MAP 181/80-122 mmHg. Post-hydralazine : PAP-mean 45/30-33 mmHg; PCWP mercury, LVP-EDP 152/54-20 mmHg with with AOP-MAP down to 156/67 mmHg. Ao sat 95%, PA sat 61% (with significant variation between 54 to 65%) => Fick Cardiac Output-Index mildly reduced at 4.93-2.3.  PROCEDURE PERFORMED  Time Out: Verified patient identification, verified procedure, site/side was marked, verified correct patient position,  special equipment/implants available, medications/allergies/relevent history reviewed, required imaging and test results available. Performed.  Access:  Direct ultrasound guidance used.  Permanent image obtained and placed on chart. Right radial Artery: 6 Fr sheath -- Seldinger technique using Micropuncture Kit 10 mL radial cocktail IA; 5000 units IV Heparin  * Right Brachial/Antecubital Vein: The existing 18-gauge IV was exchanged over a wire for a 5Fr  sheath, however given the angulation of the wire advancement, the sheath would not advance around a significant bend I therefore chose to abort this access and turn to new ultrasound-guided access. *Right antecubita/brachial vein: 5 Fr Sheath - fluoroscopically guided modified Seldinger Technique   Right Heart Catheterization: 5 Fr Swan Ganz catheter advanced under fluoroscopy over a 0.25 Glidewire to the right atrium..  With balloon inflated, the catheter was advanced beyond the RA into the RV, then PCWP-PA for hemodynamic measurement.  Simultaneous FA & PA blood gases checked for SaO2% to calculate FICK CO/CI. The Catheter was then removed completely out of the body with balloon deflated.  Left Heart Catheterization: 5 Fr Catheters advanced or exchanged over a J-wire under direct fluoroscopic guidance into the ascending aorta; take 4.0 catheter advanced first.  * LV Hemodynamics (no LV Gram): TIG 4.0 catheter initially, and then exchanged for Angled Pigtail Catheter due to significant ectopy with catheter placement * Left & Right Coronary Artery Cineangiography: Take 4.0 catheter   Upon completion of Angiogaphy, the catheter was removed completely out of the body over a wire, without complication.  Brachial Sheath(s) removed in the Cath Lab with manual pressure for hemostasis.    Radial sheath removed in the Cardiac Catheterization lab with TR Band placed for hemostasis.  TR Band: 0855 Hours; 12 mL air  MEDICATIONS  * Radial Cocktail: 3 mg  Verapmil in 10 mL NS * Isovue Contrast: 45 mL * Heparin : 5000 units  ANESTHESIA:   local and IV sedation * SQ Lidocaine  3mL *1 mg IV Versed , 25 mcg fentanyl   EBL:  <50 mL  COUNTS:  YES  PATIENT DISPOSITION:  PACU - hemodynamically stable.  Patient was stable before during after the procedure.  TOURNIQUET:  * No tourniquets in log *  DICTATION: .Note written in EPIC  PLAN OF CARE: Discharge to home after PACU; Okay to restart Eliquis  p.m. 02/21/2024 Okay to restart Farxiga  02/22/2024 Patient should also restart Entresto  at prescribed dose Lasix  converted from standing dose to as needed for weight gain more than 3 pounds in 1 day, 5 pounds in 1 week or worsening edema/dyspnea  Follow-up with Dr. Floretta as scheduled.    Delay start of Pharmacological VTE agent (>24hrs) due to surgical blood loss or risk of bleeding: not applicable    Alm Clay, MD   "

## 2024-02-21 NOTE — Interval H&P Note (Signed)
 History and Physical Interval Note:  02/21/2024 7:47 AM  Casey Larsen  has presented today for surgery, with the diagnosis of RECENT NSTEMI / CARDIOMYOPATHY/CORONARY ARTERY CALCIFICATION.  The various methods of treatment have been discussed with the patient and family. After consideration of risks, benefits and other options for treatment, the patient has consented to  Procedures: RIGHT/LEFT HEART CATH AND CORONARY ANGIOGRAPHY (N/A)  PERCUTANEOUS CORONARY INTERVENTION  as a surgical intervention.  The patient's history has been reviewed, patient examined, no change in status, stable for surgery.  I have reviewed the patient's chart and labs.  Questions were answered to the patient's satisfaction.    Cath Lab Visit (complete for each Cath Lab visit)  Clinical Evaluation Leading to the Procedure:   ACS: No.  Non-ACS:    Anginal Classification: CCS I  Anti-ischemic medical therapy: Maximal Therapy (2 or more classes of medications)  Non-Invasive Test Results: Equivocal test results - nO PRIOR ISCHEMIC EVALUATION  Prior CABG: No previous CABG     Alm Clay

## 2024-02-21 NOTE — Discharge Instructions (Signed)

## 2024-02-22 ENCOUNTER — Encounter (HOSPITAL_COMMUNITY): Payer: Self-pay | Admitting: Cardiology

## 2024-02-26 ENCOUNTER — Ambulatory Visit

## 2024-02-26 ENCOUNTER — Encounter: Payer: Self-pay | Admitting: Nurse Practitioner

## 2024-02-26 VITALS — BP 126/74 | HR 64 | Ht 67.0 in | Wt 229.0 lb

## 2024-02-26 DIAGNOSIS — I5022 Chronic systolic (congestive) heart failure: Secondary | ICD-10-CM | POA: Diagnosis not present

## 2024-02-26 DIAGNOSIS — I480 Paroxysmal atrial fibrillation: Secondary | ICD-10-CM

## 2024-02-26 DIAGNOSIS — I2510 Atherosclerotic heart disease of native coronary artery without angina pectoris: Secondary | ICD-10-CM | POA: Diagnosis not present

## 2024-02-26 DIAGNOSIS — I10 Essential (primary) hypertension: Secondary | ICD-10-CM

## 2024-02-26 NOTE — Progress Notes (Signed)
 " Cardiology Office Note   Date: 02/26/2024  ID:  Casey Larsen [birth year] [id number] PCP: Shepard Ade, MD  [facility] Providers Cardiologist: Georganna Archer, MD     Chief Complaint: Casey Larsen is a 69 y.o.female with PMH of HFmrEF with LVEF 40-45% on echo at outside facility 12/2023, mild non-obstructive CAD on LHC 01/2024, hypertension, hyperlipidemia, PAF who presents to the clinic for cardiac catheterization follow-up.    Ms. Derks was hospitalized at an outside facility 01/01/2024-01/06/2024 with respiratory failure.  She was found to have multiple electrolyte derangements was in atrial fibrillation with RVR, HR 130s-140s.  She spontaneously converted.  She was also significantly hypertensive.  Echo showed LVEF 40-45%.  Troponins elevated, ischemic evaluation deferred.  She was discharged on Eliquis , Farxiga , Lasix , lisinopril , metoprolol , and spironolactone .  Seen in in the atrial fibrillation clinic 01/27/2024, set up for an ablation in 03/2024.  Seen by Dr. Archer 02/07/2024.  Lisinopril  was transitioned to Entresto .  She was scheduled for left and right cardiac catheterization.  This was done 02/21/2024 and showed minimal diffuse non-obstructive CAD. Her cardiac index was mildly reduced at 2.3, filling pressures normal. Lasix  recommended to take as needed. She was also noted to be hypertensive during the procedure and was treated with IV hydralazine .    History of Present Illness: Today she is doing well. Tells me that she was anxious during her cardiac catheterization.  She is monitoring her blood pressure at home with readings similar to ours today, 120s/70s.  She is taking Lasix  40 mg daily and is concerned that this could affect her kidney function.  Her weight has been stable at 229 pounds, chronically sleeps with 2 pillows for comfort.  Has had very brief intermittent episodes of palpitations that wake her up at night since her hospitalization.  She is sedentary  given the nature of her job, but she plans to increase her activity with her treadmill in the future after her ablation.  ROS: Denies chest pain, shortness of breath, orthopnea, PND, lower extremity edema, syncope, abnormal bleeding.   Studies Reviewed: The following studies were reviewed today: Cardiac Studies & Procedures   ______________________________________________________________________________________________ CARDIAC CATHETERIZATION  CARDIAC CATHETERIZATION 02/21/2024  Conclusion   Mid LAD lesion is 20% stenosed.   Ost LAD to Prox LAD lesion is 10% stenosed.   In the absence of any other complications or medical issues, we expect the patient to be ready for discharge from a cath perspective on 02/21/2024.   Recommend to resume Apixaban , at currently prescribed dose and frequency on 02/21/2024.   Concurrent antiplatelet therapy not recommended.     POST-OPERATIVE DIAGNOSIS: Angiographically minimal CAD with mild diffuse 10 to 20% stenosis and proximal calcification, but no significant stenoses.  Right dominant, but significant LCx. Systemic Hypertension-pressures of 180s/80s reduced to 150s /60s with 10 mg of hydralazine  Hypersensitive left ventricle with significant ectopy with catheter placement. Right Heart Cath Pressures performed pre and post IV hydralazine  for treatment of systemic hypertension with blood pressure Pre--hydralazine : RAP 14 mm acute, RV PF and EDP 51/4-16 Miller mercury; PAP-mean 42/23-30 mmHg.  LV PF and EDP 180/14-34 mmHg with AOP/MAP 181/80-122 mmHg. Post-hydralazine : PAP-mean 45/30-33 mmHg; PCWP mercury, LVP-EDP 152/54-20 mmHg with with AOP-MAP down to 156/67 mmHg. Ao sat 95%, PA sat 61% (with significant variation between 54 to 65%) => Fick Cardiac Output-Index mildly reduced at 4.93-2.3.  PLAN OF CARE: Discharge to home after PACU; In the absence of any other complications or medical issues, we expect the patient  to be ready for discharge from a cath  perspective on 02/21/2024. Okay to restart Eliquis  p.m. 02/21/2024; Concurrent antiplatelet therapy not recommended. Okay to restart Farxiga  02/22/2024 Patient should also restart Entresto  at prescribed dose Lasix  converted from standing dose to as needed for weight gain more than 3 pounds in 1 day, 5 pounds in 1 week or worsening edema/dyspnea    Alm MICAEL Clay, MD, MS Alm Clay, M.D., M.S. Interventional Cardiologist [facility] Pager # [phone]  Findings Coronary Findings Diagnostic  Dominance: Right  Left Anterior Descending Vessel is large. Ost LAD to Prox LAD lesion is 10% stenosed. The lesion is mildly calcified. Mid LAD lesion is 20% stenosed. The lesion is eccentric. The lesion is mildly calcified.  First Diagonal Branch Vessel is large in size.  Left Circumflex Vessel is large.  First Obtuse Marginal Branch Vessel is large in size.  Left Posterior Atrioventricular Artery Vessel is large in size.  Right Coronary Artery Vessel was injected. Vessel is moderate in size. The vessel exhibits minimal luminal irregularities.  Acute Marginal Branch Vessel is small in size.  Right Ventricular Branch Vessel is small in size.  Intervention  No interventions have been documented.     ECHOCARDIOGRAM  ECHOCARDIOGRAM COMPLETE 03/05/2022  Narrative ECHOCARDIOGRAM REPORT    [name] Date of Exam: 03/05/2022 Medical Rec #:  [id number]        Height:       67.0 in Accession #:    [id number]       Weight:       236.0 lb Date of Birth:  08/20/1955         BSA:          2.170 m Patient Age:    66 years         BP:           156/75 mmHg Patient Gender: F                HR:           78 bpm. Exam Location:  Inpatient  Procedure: 2D Echo, Cardiac Doppler and Color Doppler  Indications:    R94.31 Abnormal EKG, Dyspnea  History:        Patient has no prior history of Echocardiogram examinations. Risk Factors:Dyslipidemia and  Hypertension.  Sonographer:    Morna Luis Referring Phys: [id number] DAVID MANUEL ORTIZ  IMPRESSIONS   1. Left ventricular ejection fraction, by estimation, is 60 to 65%. The left ventricle has normal function. The left ventricle has no regional wall motion abnormalities. Left ventricular diastolic parameters are indeterminate. 2. Right ventricular systolic function is normal. The right ventricular size is normal. There is normal pulmonary artery systolic pressure. 3. Left atrial size was mildly dilated. 4. The mitral valve is normal in structure. Trivial mitral valve regurgitation. No evidence of mitral stenosis. 5. The aortic valve is tricuspid. There is mild calcification of the aortic valve. Aortic valve regurgitation is not visualized. No aortic stenosis is present. 6. The inferior vena cava is dilated in size with >50% respiratory variability, suggesting right atrial pressure of 8 mmHg.  Comparison(s): No prior Echocardiogram.  Conclusion(s)/Recommendation(s): Normal biventricular function without evidence of hemodynamically significant valvular heart disease.  FINDINGS Left Ventricle: Left ventricular ejection fraction, by estimation, is 60 to 65%. The left ventricle has normal function. The left ventricle has no regional wall motion abnormalities. The left ventricular internal cavity size was normal in size. There is no  left ventricular hypertrophy. Left ventricular diastolic parameters are indeterminate.  Right Ventricle: The right ventricular size is normal. No increase in right ventricular wall thickness. Right ventricular systolic function is normal. There is normal pulmonary artery systolic pressure. The tricuspid regurgitant velocity is 2.52 m/s, and with an assumed right atrial pressure of 8 mmHg, the estimated right ventricular systolic pressure is 33.4 mmHg.  Left Atrium: Left atrial size was mildly dilated.  Right Atrium: Right atrial size was normal in  size.  Pericardium: There is no evidence of pericardial effusion.  Mitral Valve: The mitral valve is normal in structure. Trivial mitral valve regurgitation. No evidence of mitral valve stenosis.  Tricuspid Valve: The tricuspid valve is normal in structure. Tricuspid valve regurgitation is trivial. No evidence of tricuspid stenosis.  Aortic Valve: The aortic valve is tricuspid. There is mild calcification of the aortic valve. Aortic valve regurgitation is not visualized. No aortic stenosis is present.  Pulmonic Valve: The pulmonic valve was grossly normal. Pulmonic valve regurgitation is not visualized. No evidence of pulmonic stenosis.  Aorta: The aortic root, ascending aorta, aortic arch and descending aorta are all structurally normal, with no evidence of dilitation or obstruction.  Venous: The inferior vena cava is dilated in size with greater than 50% respiratory variability, suggesting right atrial pressure of 8 mmHg.  IAS/Shunts: No atrial level shunt detected by color flow Doppler.   LEFT VENTRICLE PLAX 2D LVIDd:         4.20 cm      Diastology LVIDs:         2.70 cm      LV e' medial:    6.31 cm/s LV PW:         0.80 cm      LV E/e' medial:  19.3 LV IVS:        0.80 cm      LV e' lateral:   5.71 cm/s LVOT diam:     1.80 cm      LV E/e' lateral: 21.4 LV SV:         65 LV SV Index:   30 LVOT Area:     2.54 cm  LV Volumes (MOD) LV vol d, MOD A2C: 128.0 ml LV vol d, MOD A4C: 127.0 ml LV vol s, MOD A2C: 51.0 ml LV vol s, MOD A4C: 44.2 ml LV SV MOD A2C:     77.0 ml LV SV MOD A4C:     127.0 ml LV SV MOD BP:      79.6 ml  RIGHT VENTRICLE             IVC RV Basal diam:  3.40 cm     IVC diam: 2.20 cm RV S prime:     17.20 cm/s TAPSE (M-mode): 2.2 cm  LEFT ATRIUM             Index        RIGHT ATRIUM           Index LA diam:        4.10 cm 1.89 cm/m   RA Area:     16.40 cm LA Vol (A2C):   64.3 ml 29.64 ml/m  RA Volume:   35.80 ml  16.50 ml/m LA Vol (A4C):   62.6 ml  28.85 ml/m LA Biplane Vol: 68.7 ml 31.66 ml/m AORTIC VALVE LVOT Vmax:   105.00 cm/s LVOT Vmean:  69.100 cm/s LVOT VTI:    0.254 m  AORTA Ao Root diam: 3.10 cm Ao  Asc diam:  3.10 cm  MITRAL VALVE                TRICUSPID VALVE MV Area (PHT): 3.21 cm     TR Peak grad:   25.4 mmHg MV Decel Time: 236 msec     TR Vmax:        252.00 cm/s MV E velocity: 122.00 cm/s MV A velocity: 167.00 cm/s  SHUNTS MV E/A ratio:  0.73         Systemic VTI:  0.25 m Systemic Diam: 1.80 cm  Shelda Bruckner MD Electronically signed by Shelda Bruckner MD Signature Date/Time: 03/05/2022/4:29:19 PM    Final          ______________________________________________________________________________________________                        Physical Exam: VS: BP 126/74 (BP Location: Left Arm, Patient Position: Sitting, Cuff Size: Large)   Pulse 64   Ht 5' 7 (1.702 m)   Wt 229 lb (103.9 kg)   SpO2 94%   BMI 35.87 kg/m  Wt Readings from Last 3 Encounters:  02/26/24 229 lb (103.9 kg)  02/21/24 229 lb (103.9 kg)  02/07/24 229 lb 6.4 oz (104.1 kg)    GEN: Well nourished, in NAD HEENT: Normal NECK: No JVD CARDIAC: Bradycardic, RRR, no murmurs, rubs, gallops RESPIRATORY: Clear to auscultation bilaterally ABDOMEN: Soft, non-tender, non-distended MUSCULOSKELETAL: No edema, no deformity  SKIN: Right radial cath site clean/dry/intact, scattered ecchymosis  NEUROLOGIC:  Alert and oriented x 3 PSYCHIATRIC:  Normal affect   Assessment & Plan: 1. PAF: Newly diagnosed during an admission at an outside facility 12/2023. She follows in the [facility] Fib [facility] and has an appointment with EP at the end of the month to discuss an ablation. Maintaining sinus rhythm upon auscultation today. Has had very brief intermittent palpitations since hospitalization in 12/2023. - Continue Eliquis  5 mg twice daily - Continue Toprol -XL 100 mg daily  2. HFmrEF: Had an echo 12/2023 during an admission at an  outside facility that showed LVEF 40-45% in the setting of atrial fibrillation with RVR. Her cardiac index was mildly reduced at 2.3 during RHC 02/21/2024. Has repeat echocardiogram scheduled for 03/09/2024.  Her weight is stable, denies HF symptoms today. She appears euvolemic on exam. - Continue Farxiga  10 mg daily - no UTIs/yeast infections since beginning this - Continue Entresto  49-51 mg twice daily - Continue Toprol -XL 100 mg daily - Continue spironolactone  25 mg daily - Advised to take Lasix  40 mg daily as needed for weight gain of 3 pounds overnight or 5 pounds in one week  3. Mild non-obstructive CAD: 20% mid-LAD, 10% ostial-prox LAD noted on [facility] 02/21/2024. She is fairly sedentary with her job, but she denies anginal symptoms with day-to-day activities. - Continue Toprol -XL 100 mg daily - Continue rosuvastatin  20 mg daily  4. Hypertension: BP noted to be 150s-180s/60s-80s during cardiac catheterization 02/21/2024. She was treated with IV hydralazine . She attributes this to significant anxiety prior to and during procedure. Her BP is 126/74 today, similar to home readings. - Treatment in the setting of HF as above  Dispo: Follow-up as scheduled with Dr. Floretta and Dr. Inocencio per request.  Signed, Saddie GORMAN Cleaves, NP 02/26/2024 3:27 PM [facility] "

## 2024-02-26 NOTE — Patient Instructions (Signed)
 Medication Instructions:  Furosemide  (Lasix ) 40 mg daily as needed only for swelling/weight gain of 3 lb overnight or 5 lb in 1 week.   *If you need a refill on your cardiac medications before your next appointment, please call your pharmacy*  Lab Work: NONE ordered at this time of appointment   Testing/Procedures: NONE ordered at this time of appointment   Follow-Up: At [facility], you and your health needs are our priority.  As part of our continuing mission to provide you with exceptional heart care, our providers are all part of one team.  This team includes your primary Cardiologist (physician) and Advanced Practice Providers or APPs (Physician Assistants and Nurse Practitioners) who all work together to provide you with the care you need, when you need it.  Your next appointment:    Keep follow up appointments   We recommend signing up for the patient portal called MyChart.  Sign up information is provided on this After Visit Summary.  MyChart is used to connect with patients for Virtual Visits (Telemedicine).  Patients are able to view lab/test results, encounter notes, upcoming appointments, etc.  Non-urgent messages can be sent to your provider as well.   To learn more about what you can do with MyChart, go to [email or web address].   Other Instructions

## 2024-02-27 ENCOUNTER — Other Ambulatory Visit: Payer: Self-pay | Admitting: Internal Medicine

## 2024-02-27 DIAGNOSIS — Z1231 Encounter for screening mammogram for malignant neoplasm of breast: Secondary | ICD-10-CM

## 2024-03-03 ENCOUNTER — Ambulatory Visit: Admitting: Cardiology

## 2024-03-05 ENCOUNTER — Ambulatory Visit

## 2024-03-06 ENCOUNTER — Ambulatory Visit

## 2024-03-09 ENCOUNTER — Ambulatory Visit (HOSPITAL_COMMUNITY)

## 2024-03-17 ENCOUNTER — Ambulatory Visit: Admitting: Student in an Organized Health Care Education/Training Program

## 2024-03-18 ENCOUNTER — Ambulatory Visit: Admitting: Cardiology

## 2024-04-16 ENCOUNTER — Encounter (HOSPITAL_COMMUNITY): Payer: Self-pay

## 2024-04-16 ENCOUNTER — Ambulatory Visit (HOSPITAL_COMMUNITY): Admit: 2024-04-16 | Admitting: Cardiology
